# Patient Record
Sex: Female | Born: 1945 | Race: White | Hispanic: No | Smoking: Never smoker
Health system: Southern US, Community
[De-identification: ages and names within clinical notes are randomized; demographics above are authoritative.]

## PROBLEM LIST (undated history)

## (undated) DIAGNOSIS — F32A Depression, unspecified: Secondary | ICD-10-CM

## (undated) DIAGNOSIS — M797 Fibromyalgia: Secondary | ICD-10-CM

## (undated) DIAGNOSIS — K219 Gastro-esophageal reflux disease without esophagitis: Secondary | ICD-10-CM

## (undated) DIAGNOSIS — F329 Major depressive disorder, single episode, unspecified: Secondary | ICD-10-CM

## (undated) DIAGNOSIS — K449 Diaphragmatic hernia without obstruction or gangrene: Secondary | ICD-10-CM

## (undated) DIAGNOSIS — M1710 Unilateral primary osteoarthritis, unspecified knee: Secondary | ICD-10-CM

## (undated) DIAGNOSIS — F419 Anxiety disorder, unspecified: Secondary | ICD-10-CM

## (undated) DIAGNOSIS — M1990 Unspecified osteoarthritis, unspecified site: Secondary | ICD-10-CM

## (undated) DIAGNOSIS — K5790 Diverticulosis of intestine, part unspecified, without perforation or abscess without bleeding: Secondary | ICD-10-CM

## (undated) DIAGNOSIS — I10 Essential (primary) hypertension: Secondary | ICD-10-CM

## (undated) DIAGNOSIS — H269 Unspecified cataract: Secondary | ICD-10-CM

## (undated) DIAGNOSIS — K317 Polyp of stomach and duodenum: Secondary | ICD-10-CM

## (undated) DIAGNOSIS — M179 Osteoarthritis of knee, unspecified: Secondary | ICD-10-CM

## (undated) HISTORY — DX: Unspecified cataract: H26.9

## (undated) HISTORY — PX: COLONOSCOPY: SHX174

## (undated) HISTORY — PX: CHOLECYSTECTOMY: SHX55

## (undated) HISTORY — DX: Gastro-esophageal reflux disease without esophagitis: K21.9

## (undated) HISTORY — DX: Polyp of stomach and duodenum: K31.7

## (undated) HISTORY — DX: Diaphragmatic hernia without obstruction or gangrene: K44.9

## (undated) HISTORY — PX: UPPER GASTROINTESTINAL ENDOSCOPY: SHX188

## (undated) HISTORY — DX: Fibromyalgia: M79.7

## (undated) HISTORY — DX: Osteoarthritis of knee, unspecified: M17.9

## (undated) HISTORY — DX: Unilateral primary osteoarthritis, unspecified knee: M17.10

## (undated) HISTORY — DX: Essential (primary) hypertension: I10

## (undated) HISTORY — PX: POLYPECTOMY: SHX149

## (undated) HISTORY — DX: Unspecified osteoarthritis, unspecified site: M19.90

## (undated) HISTORY — DX: Depression, unspecified: F32.A

## (undated) HISTORY — DX: Diverticulosis of intestine, part unspecified, without perforation or abscess without bleeding: K57.90

## (undated) HISTORY — DX: Anxiety disorder, unspecified: F41.9

## (undated) NOTE — *Deleted (*Deleted)
Diagnosis: COVID-19  Physician: Dr. Patrick Wright  Procedure: [facility] Med: Sotrovimab infusion - Provided patient with sotrovimab fact sheet for patients, parents, and caregivers prior to infusion.   Complications: No immediate complications noted  Discharge: Discharged home    

---
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
## 2004-03-22 ENCOUNTER — Ambulatory Visit (HOSPITAL_COMMUNITY): Admission: RE | Admit: 2004-03-22 | Discharge: 2004-03-22 | Payer: Self-pay | Admitting: Geriatric Medicine

## 2016-01-13 DIAGNOSIS — M79675 Pain in left toe(s): Secondary | ICD-10-CM | POA: Diagnosis not present

## 2016-01-13 DIAGNOSIS — M7989 Other specified soft tissue disorders: Secondary | ICD-10-CM | POA: Diagnosis not present

## 2016-01-21 DIAGNOSIS — Z1231 Encounter for screening mammogram for malignant neoplasm of breast: Secondary | ICD-10-CM | POA: Diagnosis not present

## 2016-03-04 DIAGNOSIS — R5383 Other fatigue: Secondary | ICD-10-CM | POA: Diagnosis not present

## 2016-03-04 DIAGNOSIS — I10 Essential (primary) hypertension: Secondary | ICD-10-CM | POA: Diagnosis not present

## 2016-03-04 DIAGNOSIS — F3342 Major depressive disorder, recurrent, in full remission: Secondary | ICD-10-CM | POA: Diagnosis not present

## 2016-03-04 DIAGNOSIS — R7989 Other specified abnormal findings of blood chemistry: Secondary | ICD-10-CM | POA: Diagnosis not present

## 2016-03-08 DIAGNOSIS — F4321 Adjustment disorder with depressed mood: Secondary | ICD-10-CM | POA: Diagnosis not present

## 2016-03-08 DIAGNOSIS — N952 Postmenopausal atrophic vaginitis: Secondary | ICD-10-CM | POA: Diagnosis not present

## 2016-03-10 DIAGNOSIS — R748 Abnormal levels of other serum enzymes: Secondary | ICD-10-CM | POA: Diagnosis not present

## 2016-03-11 DIAGNOSIS — R918 Other nonspecific abnormal finding of lung field: Secondary | ICD-10-CM | POA: Diagnosis not present

## 2016-03-11 DIAGNOSIS — K7689 Other specified diseases of liver: Secondary | ICD-10-CM | POA: Diagnosis not present

## 2016-05-17 DIAGNOSIS — H16223 Keratoconjunctivitis sicca, not specified as Sjogren's, bilateral: Secondary | ICD-10-CM | POA: Diagnosis not present

## 2016-05-17 DIAGNOSIS — H02402 Unspecified ptosis of left eyelid: Secondary | ICD-10-CM | POA: Diagnosis not present

## 2016-05-17 DIAGNOSIS — H40023 Open angle with borderline findings, high risk, bilateral: Secondary | ICD-10-CM | POA: Diagnosis not present

## 2016-05-17 DIAGNOSIS — H524 Presbyopia: Secondary | ICD-10-CM | POA: Diagnosis not present

## 2016-05-17 DIAGNOSIS — H52203 Unspecified astigmatism, bilateral: Secondary | ICD-10-CM | POA: Diagnosis not present

## 2016-05-20 DIAGNOSIS — R945 Abnormal results of liver function studies: Secondary | ICD-10-CM | POA: Diagnosis not present

## 2016-05-20 DIAGNOSIS — I10 Essential (primary) hypertension: Secondary | ICD-10-CM | POA: Diagnosis not present

## 2016-05-20 DIAGNOSIS — F3342 Major depressive disorder, recurrent, in full remission: Secondary | ICD-10-CM | POA: Diagnosis not present

## 2016-05-20 DIAGNOSIS — L290 Pruritus ani: Secondary | ICD-10-CM | POA: Diagnosis not present

## 2016-05-20 DIAGNOSIS — K760 Fatty (change of) liver, not elsewhere classified: Secondary | ICD-10-CM | POA: Diagnosis not present

## 2016-05-20 LAB — BASIC METABOLIC PANEL
CREATININE: 0.8 (ref <=1.1)
Glucose: 99
Potassium: 4.4 (ref 3.4–5.3)
SODIUM: 139 (ref 137–147)

## 2016-05-20 LAB — HEPATIC FUNCTION PANEL
ALK PHOS: 82 (ref 25–125)
ALT: 119 — AB (ref 7–35)
AST: 83 — AB (ref 13–35)

## 2016-06-07 DIAGNOSIS — M1712 Unilateral primary osteoarthritis, left knee: Secondary | ICD-10-CM | POA: Diagnosis not present

## 2016-06-07 DIAGNOSIS — Z01818 Encounter for other preprocedural examination: Secondary | ICD-10-CM | POA: Diagnosis not present

## 2016-06-07 DIAGNOSIS — M25462 Effusion, left knee: Secondary | ICD-10-CM | POA: Diagnosis not present

## 2016-06-28 DIAGNOSIS — H02402 Unspecified ptosis of left eyelid: Secondary | ICD-10-CM | POA: Diagnosis not present

## 2016-08-23 DIAGNOSIS — H02402 Unspecified ptosis of left eyelid: Secondary | ICD-10-CM | POA: Diagnosis not present

## 2016-11-22 NOTE — Progress Notes (Addendum)
[place] at [facility] [address] [place], [place] [address] [phone]  Date:  11/23/2016   Name:  Misty Rivas   DOB:  [birth year]   MRN:  [id number]  PCP:  Darreld Mclean, MD    Chief Complaint: Establish Care and Hip Pain Marcha Solders )   History of Present Illness:  Syna Gad is a 71 y.o. very pleasant female patient who presents with the following:  New patient here today to establish care She is "pretty much retired"- her husband was the chaplain at [employer] for about 20 years and he is now retired.  He has some dementia thorugh due to head injuries incurred while playing college football. This is a pretty new thing to the family- just dx a few months ago- and it has pretty hard so far for Trishelle.  She is not really able to leave her husband alone- he follows her everywhere- and she is feeling overwhelmed  She has 2 sons and 4 grands She likes to work in her yard, "and I try not to eat and I try to exercise."  2 of her grands are in [place], the others are in [place]   She has generally been in good health Does have hiatal hernia and some reflux- she has to watch her diet carefully Had c/s x2, chole  Notes that she does not sleep that well Dr. Kris Mouton- her GYN- just retired She was seeing Dr. Nance Pew in [place] in the past for her primary care but has decided to come here instead on the recommendation of a friend   She did have a knee scope a couple of years ago, and is planning to have a left replacement in January She also has right hip pain which is a concern for her today She has not had any x-rays of her right hip as of yet  Her right hip has bothered her for 6 months or so- worse the last couple of weeks.  It is actually bother her more than her knee at this time! She is not aware of any particular injury to her hip The hip pain will wax and wane squatting and bending bothers her a lot  She is using aleve for  her hip and knee   She has been using nexium for a "long long time"  She had heard that chronic PPI use might not be good for her bone density and wonders if she should make a change   She is a never smoker She did have a dexa scan- she thinks 4-5 years ago  She was also told that her LFTs were high and that she had fatty liver on [place] not too long ago- she is a bit worried about this and would like to follow-up on her LFTs today Patient Active Problem List   Diagnosis Date Noted  . Essential hypertension 11/23/2016  . Estrogen deficiency 11/23/2016  pulse 60 on 5/18 58 on  2/18  She has noted higher BP at home for about 6 months now - about 140/80-90 She is on metoprolol She has not noted any dizzy/ lightheaded feeling   Past Medical History:  Diagnosis Date  . Arthritis     Past Surgical History:  Procedure Laterality Date  . CESAREAN SECTION    . CHOLECYSTECTOMY    . TONSILLECTOMY  1955    Social History   Tobacco Use  . Smoking status: Never Smoker  . Smokeless tobacco:  Never Used  Substance Use Topics  . Alcohol use: No    Frequency: Never  . Drug use: No    Family History  Problem Relation Age of Onset  . Mental illness Mother   . Heart disease Father   . Cancer Father   . Cancer Sister     Allergies  Allergen Reactions  . Lisinopril Other (See Comments)    Per pt cannot tolerate medication makes her fatigued  . Sulfa Antibiotics Other (See Comments) and Rash    Other reaction(s): Other (See Comments) Unknown reaction per pt, hasnt taken in a long time. Unknown reaction per pt, hasnt taken in a long time.     Medication list has been reviewed and updated.  Current Outpatient Medications on File Prior to Visit  Medication Sig Dispense Refill  . Calcium Carb-Cholecalciferol (CALCIUM-VITAMIN D) 500-200 MG-UNIT tablet Take by mouth.    . escitalopram (LEXAPRO) 20 MG tablet escitalopram 20 mg tablet    . esomeprazole (NEXIUM) 20 MG capsule Take 20 mg  by mouth.    . metoprolol tartrate (LOPRESSOR) 50 MG tablet Take 25 mg 2 (two) times daily by mouth.  6  . Multiple Vitamin (MULTI-VITAMINS) TABS Take by mouth.    . naproxen sodium (ALEVE) 220 MG tablet Take 220 mg by mouth.     No current facility-administered medications on file prior to visit.     Review of Systems:  As per HPI- otherwise negative. No fever or chills Admits that she has been stressed by things at home    Physical Examination: Vitals:   11/23/16 1416 11/23/16 1441  BP: (!) 174/96 (!) 150/90  Pulse: (!) 49 60  Temp: 97.7 F (36.5 C)   SpO2: 97%    Vitals:   11/23/16 1416  Weight: 186 lb 6.4 oz (84.6 kg)  Height: 5' 2"  (1.575 m)   Body mass index is 34.09 kg/m. Ideal Body Weight: Weight in (lb) to have BMI = 25: 136.4  GEN: WDWN, NAD, Non-toxic, A & O x 3, obese, otherwise looks well HEENT: Atraumatic, Normocephalic. Neck supple. No masses, No LAD.  Bilateral TM wnl, oropharynx normal.  PEERL,EOMI.   Ears and Nose: No external deformity. CV: RRR, No M/G/R. No JVD. No thrill. No extra heart sounds. PULM: CTA B, no wheezes, crackles, rhonchi. No retractions. No resp. distress. No accessory muscle use. ABD: S, NT, ND, +BS. No rebound. No HSM. EXTR: No c/c/e NEURO Normal gait.  PSYCH: Normally interactive. Conversant. Not depressed or anxious appearing.  Calm demeanor. A bit tearful while discussing her husband's situation  Right hip: normal ROM, no pain with joint manipulation.  She is tender over the lateral hip but seems a bit more posterior than is typical for GTB   Assessment and Plan: Essential hypertension - Plan: amLODipine (NORVASC) 5 MG tablet  Hepatic steatosis - Plan: Comprehensive metabolic panel  Right hip pain - Plan: DG HIP UNILAT W OR W/O PELVIS 2-3 VIEWS RIGHT  Estrogen deficiency - Plan: DG Bone Density  Other stressful life events affecting family and household   Here today to establish care and discuss a few concerns Her BP  is still high- will add 2.5 mg of amlodipine to her metoprolol Await hip films Check CMP Discussed getting some in home help to give her a break from care-giving Will plan further follow- up pending labs.  Signed Lamar Blinks, MD  11/15- received her labs and x-rays   From care everywhere  5/18       2/18      12/17 AST  83 107      46 ALT  119 125       66  abd [place] 3/18 IMPRESSION: 1. Cholecystectomy.No biliary distention.  2. Increased hepatic echogenicity consistent with fatty infiltration and/or hepatocellular disease.  Results for orders placed or performed in visit on 11/23/16  Comprehensive metabolic panel  Result Value Ref Range   Sodium 141 135 - 145 mEq/L   Potassium 4.7 3.5 - 5.1 mEq/L   Chloride 104 96 - 112 mEq/L   CO2 29 19 - 32 mEq/L   Glucose, Bld 85 70 - 99 mg/dL   BUN 17 6 - 23 mg/dL   Creatinine, Ser 0.79 0.40 - 1.20 mg/dL   Total Bilirubin 0.8 0.2 - 1.2 mg/dL   Alkaline Phosphatase 68 39 - 117 U/L   AST 152 (H) 0 - 37 U/L   ALT 161 (H) 0 - 35 U/L   Total Protein 7.2 6.0 - 8.3 g/dL   Albumin 4.4 3.5 - 5.2 g/dL   Calcium 10.1 8.4 - 10.5 mg/dL   GFR 76.48 >60.00 mL/min   Dg Hip Unilat W Or W/o Pelvis 2-3 Views Right  Result Date: 11/24/2016 CLINICAL DATA:  Right hand pain.  No prior injury. EXAM: DG HIP (WITH OR WITHOUT PELVIS) 2-3V RIGHT COMPARISON:  No prior exam available for comparison. FINDINGS: Degenerative changes lumbar spine and both hips. No acute bony or joint abnormality identified. Rounded ossification noted adjacent to the greater trochanter most likely dystrophic calcification. IMPRESSION: Degenerative changes lumbar spine and both hips. No acute abnormality identified. Electronically Signed   By: Marcello Moores  Register   On: 11/24/2016 07:21    Received her labs and films

## 2016-11-23 ENCOUNTER — Encounter: Payer: Self-pay | Admitting: Family Medicine

## 2016-11-23 ENCOUNTER — Ambulatory Visit (HOSPITAL_BASED_OUTPATIENT_CLINIC_OR_DEPARTMENT_OTHER)
Admission: RE | Admit: 2016-11-23 | Discharge: 2016-11-23 | Disposition: A | Discharge status: Home / Self Care | Payer: Medicare Other | Source: Ambulatory Visit | Attending: Family Medicine | Admitting: Family Medicine

## 2016-11-23 ENCOUNTER — Ambulatory Visit (INDEPENDENT_AMBULATORY_CARE_PROVIDER_SITE_OTHER): Payer: Medicare Other | Admitting: Family Medicine

## 2016-11-23 VITALS — BP 150/90 | HR 60 | Temp 97.7°F | Ht 62.0 in | Wt 186.4 lb

## 2016-11-23 DIAGNOSIS — M25551 Pain in right hip: Secondary | ICD-10-CM | POA: Insufficient documentation

## 2016-11-23 DIAGNOSIS — E2839 Other primary ovarian failure: Secondary | ICD-10-CM | POA: Diagnosis not present

## 2016-11-23 DIAGNOSIS — Z6379 Other stressful life events affecting family and household: Secondary | ICD-10-CM

## 2016-11-23 DIAGNOSIS — I10 Essential (primary) hypertension: Secondary | ICD-10-CM

## 2016-11-23 DIAGNOSIS — M160 Bilateral primary osteoarthritis of hip: Secondary | ICD-10-CM | POA: Insufficient documentation

## 2016-11-23 DIAGNOSIS — M1611 Unilateral primary osteoarthritis, right hip: Secondary | ICD-10-CM | POA: Diagnosis not present

## 2016-11-23 DIAGNOSIS — K760 Fatty (change of) liver, not elsewhere classified: Secondary | ICD-10-CM

## 2016-11-23 DIAGNOSIS — M47896 Other spondylosis, lumbar region: Secondary | ICD-10-CM | POA: Diagnosis not present

## 2016-11-23 MED ORDER — AMLODIPINE BESYLATE 5 MG PO TABS: 5.0000 mg | ORAL_TABLET | Freq: Every day | ORAL | 3 refills | Status: DC

## 2016-11-23 NOTE — Patient Instructions (Signed)
It was very nice to see you today!  Take care and I will be in touch with your hip x-ray report and your labs asap Please add amlodipine- start with a 1/2 tablet- to your blood pressure regimen.  Please continue to monitor your BP at home- would like to see you about 120- 130/ 80s.    As we discussed, you likely would benefit from some "respite care," or someone to help your husband so you can get our on your own for a little while. This break will also make you a better and more patient caregiver, so it is not a selfish thing to do at all!

## 2016-11-24 ENCOUNTER — Encounter: Payer: Self-pay | Admitting: Family Medicine

## 2016-11-24 LAB — COMPREHENSIVE METABOLIC PANEL
ALK PHOS: 68 U/L (ref 39–117)
ALT: 161 U/L — AB (ref 0–35)
AST: 152 U/L — ABNORMAL HIGH (ref 0–37)
Albumin: 4.4 g/dL (ref 3.5–5.2)
BILIRUBIN TOTAL: 0.8 mg/dL (ref 0.2–1.2)
BUN: 17 mg/dL (ref 6–23)
CALCIUM: 10.1 mg/dL (ref 8.4–10.5)
CO2: 29 mEq/L (ref 19–32)
Chloride: 104 mEq/L (ref 96–112)
Creatinine, Ser: 0.79 mg/dL (ref 0.40–1.20)
GFR: 76.48 mL/min (ref >60.00)
Glucose, Bld: 85 mg/dL (ref 70–99)
Potassium: 4.7 mEq/L (ref 3.5–5.1)
Sodium: 141 mEq/L (ref 135–145)
TOTAL PROTEIN: 7.2 g/dL (ref 6.0–8.3)

## 2016-11-25 ENCOUNTER — Encounter: Payer: Self-pay | Admitting: Family Medicine

## 2016-11-25 DIAGNOSIS — R7401 Elevation of levels of liver transaminase levels: Secondary | ICD-10-CM

## 2016-11-25 DIAGNOSIS — R740 Nonspecific elevation of levels of transaminase and lactic acid dehydrogenase [LDH]: Principal | ICD-10-CM

## 2016-12-03 NOTE — Progress Notes (Signed)
[place] at [facility] [address] [place], [place] [address] [phone]  Date:  12/05/2016   Name:  Misty Rivas   DOB:  [birth year]   MRN:  [id number]  PCP:  Darreld Mclean, MD    Chief Complaint: Follow-up and Injections   History of Present Illness:  Misty Rivas is a 71 y.o. very pleasant female patient who presents with the following:  She would like to try a RIGHT hip injection for bursitis - we did plain film for her recently due to hip pain. She is having left knee surgery in January as well  Dg Hip Unilat W Or W/o Pelvis 2-3 Views Right  Result Date: 11/24/2016 CLINICAL DATA:  Right hand pain.  No prior injury. EXAM: DG HIP (WITH OR WITHOUT PELVIS) 2-3V RIGHT COMPARISON:  No prior exam available for comparison. FINDINGS: Degenerative changes lumbar spine and both hips. No acute bony or joint abnormality identified. Rounded ossification noted adjacent to the greater trochanter most likely dystrophic calcification. IMPRESSION: Degenerative changes lumbar spine and both hips. No acute abnormality identified. Electronically Signed   By: Marcello Moores  Register   On: 11/24/2016 07:21   She does have elevated LFTs- she was just told about this issue earlier this year, April  She had an [place] around the same time; showed just fatty liver Her LFTs were higher than I would typically expect with fatty liver- she is interested in seeing GI as I had suggested,  Will make this referral for her  Patient Active Problem List   Diagnosis Date Noted  . Essential hypertension 11/23/2016  . Estrogen deficiency 11/23/2016    Past Medical History:  Diagnosis Date  . Arthritis     Past Surgical History:  Procedure Laterality Date  . CESAREAN SECTION    . CHOLECYSTECTOMY    . TONSILLECTOMY  1955    Social History   Tobacco Use  . Smoking status: Never Smoker  . Smokeless tobacco: Never Used  Substance Use Topics  . Alcohol use:  No    Frequency: Never  . Drug use: No    Family History  Problem Relation Age of Onset  . Mental illness Mother   . Heart disease Father   . Cancer Father   . Cancer Sister     Allergies  Allergen Reactions  . Lisinopril Other (See Comments)    Per pt cannot tolerate medication makes her fatigued  . Sulfa Antibiotics Other (See Comments) and Rash    Other reaction(s): Other (See Comments) Unknown reaction per pt, hasnt taken in a long time. Unknown reaction per pt, hasnt taken in a long time.     Medication list has been reviewed and updated.  Current Outpatient Medications on File Prior to Visit  Medication Sig Dispense Refill  . amLODipine (NORVASC) 5 MG tablet Take 1 tablet (5 mg total) daily by mouth. 90 tablet 3  . Calcium Carb-Cholecalciferol (CALCIUM-VITAMIN D) 500-200 MG-UNIT tablet Take by mouth.    . escitalopram (LEXAPRO) 20 MG tablet escitalopram 20 mg tablet    . metoprolol tartrate (LOPRESSOR) 50 MG tablet Take 25 mg 2 (two) times daily by mouth.  6  . Multiple Vitamin (MULTI-VITAMINS) TABS Take by mouth.    . naproxen sodium (ALEVE) 220 MG tablet Take 220 mg by mouth.    . esomeprazole (NEXIUM) 20 MG capsule Take 20 mg by mouth.     No current facility-administered medications on file prior  to visit.     Review of Systems:  As per HPI- otherwise negative.   Physical Examination: Vitals:   12/05/16 1105  BP: 138/88  Pulse: 74  Temp: 97.6 F (36.4 C)   Vitals:   12/05/16 1105  Weight: 188 lb (85.3 kg)  Height: 5' 2"  (1.575 m)   Body mass index is 34.39 kg/m. Ideal Body Weight: Weight in (lb) to have BMI = 25: 136.4  GEN: WDWN, NAD, Non-toxic, A & O x 3, overweight, looks well HEENT: Atraumatic, Normocephalic. Neck supple. No masses, No LAD. Ears and Nose: No external deformity. CV: RRR, No M/G/R. No JVD. No thrill. No extra heart sounds. PULM: CTA B, no wheezes, crackles, rhonchi. No retractions. No resp. distress. No accessory muscle  use. EXTR: No c/c/e NEURO Normal gait.  PSYCH: Normally interactive. Conversant. Not depressed or anxious appearing.  Calm demeanor.   Right hip: she is tender over the right GT bursa. No redness or swelling  VC obtained. Prepped hip with betadine and lidocaine. Injected into GT bursa with 40 mg depo-medrol and 2m of 1% lido.  Pt tolerated well Assessment and Plan: Bursitis of other bursa of right hip - Plan: methylPREDNISolone acetate (DEPO-MEDROL) injection 40 mg  Transaminitis - Plan: Ambulatory referral to Gastroenterology  Injection of right GT bursa as above.  Pt is advised about signs of infection to report She did notice an improvement of her sx right after injection due to lidocaine.   Referral to GI to discuss her LFTs   Signed JLamar Blinks MD

## 2016-12-05 ENCOUNTER — Encounter: Payer: Self-pay | Admitting: Family Medicine

## 2016-12-05 ENCOUNTER — Ambulatory Visit (INDEPENDENT_AMBULATORY_CARE_PROVIDER_SITE_OTHER): Payer: Medicare Other | Admitting: Family Medicine

## 2016-12-05 VITALS — BP 138/88 | HR 74 | Temp 97.6°F | Ht 62.0 in | Wt 188.0 lb

## 2016-12-05 DIAGNOSIS — R740 Nonspecific elevation of levels of transaminase and lactic acid dehydrogenase [LDH]: Secondary | ICD-10-CM | POA: Diagnosis not present

## 2016-12-05 DIAGNOSIS — M7071 Other bursitis of hip, right hip: Secondary | ICD-10-CM | POA: Diagnosis not present

## 2016-12-05 DIAGNOSIS — R7401 Elevation of levels of liver transaminase levels: Secondary | ICD-10-CM

## 2016-12-05 MED ORDER — METHYLPREDNISOLONE ACETATE 40 MG/ML IJ SUSP: 40.0000 mg | Freq: Once | INTRAMUSCULAR | Status: DC

## 2016-12-05 NOTE — Patient Instructions (Addendum)
You got an injection into your hip today- please let me know how this works for you.   I am going to refer you to a Gastroenterologist to look further at your liver   Please let me know if any sign of infection of your hip Happy holidays!

## 2016-12-06 ENCOUNTER — Encounter: Payer: Self-pay | Admitting: Nurse Practitioner

## 2016-12-07 ENCOUNTER — Ambulatory Visit (HOSPITAL_BASED_OUTPATIENT_CLINIC_OR_DEPARTMENT_OTHER)
Admission: RE | Admit: 2016-12-07 | Discharge: 2016-12-07 | Disposition: A | Discharge status: Home / Self Care | Payer: Medicare Other | Source: Ambulatory Visit | Attending: Family Medicine | Admitting: Family Medicine

## 2016-12-07 ENCOUNTER — Encounter (HOSPITAL_BASED_OUTPATIENT_CLINIC_OR_DEPARTMENT_OTHER): Payer: Self-pay | Admitting: Radiology

## 2016-12-07 ENCOUNTER — Encounter: Payer: Self-pay | Admitting: Family Medicine

## 2016-12-07 DIAGNOSIS — E2839 Other primary ovarian failure: Secondary | ICD-10-CM | POA: Insufficient documentation

## 2016-12-07 DIAGNOSIS — Z780 Asymptomatic menopausal state: Secondary | ICD-10-CM | POA: Diagnosis not present

## 2016-12-07 DIAGNOSIS — Z13820 Encounter for screening for osteoporosis: Secondary | ICD-10-CM | POA: Diagnosis not present

## 2016-12-14 ENCOUNTER — Other Ambulatory Visit (INDEPENDENT_AMBULATORY_CARE_PROVIDER_SITE_OTHER): Payer: Medicare Other

## 2016-12-14 ENCOUNTER — Ambulatory Visit (INDEPENDENT_AMBULATORY_CARE_PROVIDER_SITE_OTHER): Payer: Medicare Other | Admitting: Nurse Practitioner

## 2016-12-14 ENCOUNTER — Encounter: Payer: Self-pay | Admitting: Nurse Practitioner

## 2016-12-14 VITALS — BP 140/88 | HR 60 | Ht 62.0 in | Wt 182.5 lb

## 2016-12-14 DIAGNOSIS — R7989 Other specified abnormal findings of blood chemistry: Secondary | ICD-10-CM

## 2016-12-14 DIAGNOSIS — R945 Abnormal results of liver function studies: Secondary | ICD-10-CM

## 2016-12-14 DIAGNOSIS — Z1211 Encounter for screening for malignant neoplasm of colon: Secondary | ICD-10-CM | POA: Diagnosis not present

## 2016-12-14 LAB — HEPATIC FUNCTION PANEL
ALBUMIN: 4.6 g/dL (ref 3.5–5.2)
ALT: 163 U/L — AB (ref 0–35)
AST: 121 U/L — ABNORMAL HIGH (ref 0–37)
Alkaline Phosphatase: 72 U/L (ref 39–117)
Bilirubin, Direct: 0.2 mg/dL (ref 0.0–0.3)
TOTAL PROTEIN: 7.3 g/dL (ref 6.0–8.3)
Total Bilirubin: 0.8 mg/dL (ref 0.2–1.2)

## 2016-12-14 LAB — FERRITIN: Ferritin: 366 ng/mL — ABNORMAL HIGH (ref 10.0–291.0)

## 2016-12-14 LAB — IGA: IgA: 248 mg/dL (ref 68–378)

## 2016-12-14 LAB — PROTIME-INR
INR: 1 ratio (ref 0.8–1.0)
PROTHROMBIN TIME: 10.8 s (ref 9.6–13.1)

## 2016-12-14 NOTE — Patient Instructions (Signed)
If you are age 70 or older, your body mass index should be between 23-30. Your Body mass index is 33.38 kg/m. If this is out of the aforementioned range listed, please consider follow up with your Primary Care Provider.  If you are age 89 or younger, your body mass index should be between 19-25. Your Body mass index is 33.38 kg/m. If this is out of the aformentioned range listed, please consider follow up with your Primary Care Provider.   Your physician has requested that you go to the basement for lab work before leaving today.  Will call with results.  Thank you for choosing me and [facility].   Tye Savoy, NP

## 2016-12-14 NOTE — Progress Notes (Addendum)
Chief Complaint:  Abnormal liver chemistries   HPI:  Patient is a 71 year old female, new to the practice referred by PCP Dr. Lorelei Pont for evaluation of abnormal liver chemistries. Patient says she was first told about her abnormal liver chemistries about a year and a half ago in [facility]. I do see in [place] that she had transaminitis in December 2017. At that time her AST was 46 and ALT 66. Ultrasound was done and showed fatty liver.  In February 2018 AST was up to 107, ALT 125.  In May of this year AST 83, ALT 119. Most current labs in mid November show AST was 152, ALT 161, bilirubin and alkaline phosphatase have always been normal. No med changes except Norvasc which is just recent.  She takes Aleve about twice a week. No [place] of liver disease. She consumes ETOH only about once a month. No use of herbs / supplements. She takes Zantac for GERD.   Patient has no abdominal pain. Her weight is stable. No    Past Medical History:  Diagnosis Date  . Acid reflux   . Arthritis   . Degenerative joint disease of knee   . Hiatal hernia      Past Surgical History:  Procedure Laterality Date  . CESAREAN SECTION    . CHOLECYSTECTOMY    . TONSILLECTOMY  1955   Family History  Problem Relation Age of Onset  . Mental illness Mother   . Heart disease Father   . Prostate cancer Father   . Breast cancer Sister   . Colon cancer Neg Hx   . Stomach cancer Neg Hx   . Rectal cancer Neg Hx   . Esophageal cancer Neg Hx   . Liver cancer Neg Hx    Social History   Tobacco Use  . Smoking status: Never Smoker  . Smokeless tobacco: Never Used  Substance Use Topics  . Alcohol use: Yes    Frequency: Never    Comment: occasional once monthly  . Drug use: No   Current Outpatient Medications  Medication Sig Dispense Refill  . amLODipine (NORVASC) 5 MG tablet Take 1 tablet (5 mg total) daily by mouth. 90 tablet 3  . Calcium Carb-Cholecalciferol (CALCIUM-VITAMIN D) 500-200 MG-UNIT  tablet Take by mouth.    . escitalopram (LEXAPRO) 20 MG tablet escitalopram 20 mg tablet    . metoprolol tartrate (LOPRESSOR) 50 MG tablet Take 25 mg 2 (two) times daily by mouth.  6  . Multiple Vitamin (MULTI-VITAMINS) TABS Take by mouth.    . naproxen sodium (ALEVE) 220 MG tablet Take 220 mg by mouth as needed.      No current facility-administered medications for this visit.    Allergies  Allergen Reactions  . Lisinopril Other (See Comments)    Per pt cannot tolerate medication makes her fatigued  . Sulfa Antibiotics Other (See Comments) and Rash    Other reaction(s): Other (See Comments) Unknown reaction per pt, hasnt taken in a long time. Unknown reaction per pt, hasnt taken in a long time.      Review of Systems: Positive for arthritis, vision changes, excessive urination, and urine leakage. All other systems reviewed and negative except where noted in HPI.    Physical Exam: Ht 5' 2"  (1.575 m)   Wt 182 lb 8 oz (82.8 kg)   BMI 33.38 kg/m  Constitutional:  Well-developed, white female in no acute distress. Psychiatric: Normal mood and affect. Behavior is normal. EENT: Pupils normal.  Conjunctivae  are normal. No scleral icterus. Neck supple.  Cardiovascular: Normal rate, regular rhythm. No edema Pulmonary/chest: Effort normal and breath sounds normal. No wheezing, rales or rhonchi. Abdominal: Soft, nondistended. Nontender. Bowel sounds active throughout. There are no masses palpable. No hepatomegaly. Lymphadenopathy: No cervical adenopathy noted. Neurological: Alert and oriented to person place and time. Skin: Skin is warm and dry. No rashes noted.   ASSESSMENT AND PLAN:  1. Pleasant 71 yo female with transaminitis, apparently first recognized a year ago through [place].  Transaminases initially only mildly elevated, now ALT consistently above 100. Most recent labs : AST / ALT in 150's-160 range. U/S last year suggested fatty liver.  -Transaminases have been abnormal on  several occasions over the last year and higher than typically seen with steatosis alone. Will obtain all the markers of chronic liver disease such as ANA, ASMA, ferritin, viral hep studies, etc.. Additionally will check tTg, IgA.  -repeat LFT, INR as patient is for knee surgery in a few weeks and if progressive transaminitis she may to postpone surgery until we can sort things out.   2. Colon cancer screening. Apparently had last colonoscopy 5 years ago, says she is due for another. Will request records.    Addendum to 02/14/17: Records from [facility]. *EGD October 2015 for evaluation of GERD and gastric polyps.  Z line was normal.  Esophagus appeared normal.  A small hiatal hernia was seen.  Gastric mucosa showed erythema.  Biopsies taken.  Multiple 8-5 mm gastric polyps noted and 8 of the largest were cold snared.  Multiple smaller polyps remained.  Duodenum appeared normal.  Gastric biopsies showed mild chronic gastritis.  H. pylori stain negative.  Strict polyps compatible with fundic gland polyp, no evidence for adenomatous change.  *Colonoscopy October 2015- could not advance scope past the sigmoid due to sharp angulated turn.  Switched to pediatric scope and able to to reach the cecum.  Bowel prep was fair.  Findings included mild diverticulosis of the left colon.  No polyps.  Small hemorrhoids on retroflex view of the rectum.  Timing of next colonoscopy can be discussed with patient at time of upcoming visit with Dr. Silverio Decamp later this month. Will scan records into Epic.   Tye Savoy, NP  12/14/2016, 11:11 AM  Cc: Copland, Gay Filler, MD

## 2016-12-16 NOTE — Progress Notes (Signed)
Reviewed and agree with documentation and assessment and plan. K. Veena Nandigam , MD   

## 2016-12-18 LAB — CERULOPLASMIN: Ceruloplasmin: 31 mg/dL (ref 18–53)

## 2016-12-18 LAB — ANA: ANA: POSITIVE — AB

## 2016-12-18 LAB — HEPATITIS B SURFACE ANTIGEN: Hepatitis B Surface Ag: NONREACTIVE

## 2016-12-18 LAB — TISSUE TRANSGLUTAMINASE, IGA: (TTG) AB, IGA: 1 U/mL

## 2016-12-18 LAB — ANTI-NUCLEAR AB-TITER (ANA TITER)

## 2016-12-18 LAB — HEPATITIS C ANTIBODY
Hepatitis C Ab: NONREACTIVE
SIGNAL TO CUT-OFF: 0.01 (ref <1.00)

## 2016-12-18 LAB — HEPATITIS A ANTIBODY, TOTAL: Hepatitis A AB,Total: NONREACTIVE

## 2016-12-18 LAB — MITOCHONDRIAL ANTIBODIES: Mitochondrial M2 Ab, IgG: <=20 U

## 2016-12-18 LAB — ANTI-SMOOTH MUSCLE ANTIBODY, IGG

## 2016-12-22 ENCOUNTER — Other Ambulatory Visit: Payer: Self-pay

## 2016-12-22 ENCOUNTER — Other Ambulatory Visit: Payer: Self-pay | Admitting: Nurse Practitioner

## 2016-12-22 ENCOUNTER — Telehealth: Payer: Self-pay

## 2016-12-22 DIAGNOSIS — R945 Abnormal results of liver function studies: Principal | ICD-10-CM

## 2016-12-22 DIAGNOSIS — R7989 Other specified abnormal findings of blood chemistry: Secondary | ICD-10-CM

## 2016-12-22 NOTE — Telephone Encounter (Signed)
-----   Message from Willia Craze, NP sent at 12/21/2016  4:22 PM EST ----- Misty Rivas, please let patient know that her liver enzymes are still elevated. One of her autoimmune markers was elevated.  Would you ask her to come in for an IgG antibody and also please get a liver-kidney microsomal antibody. Thanks

## 2016-12-22 NOTE — Telephone Encounter (Signed)
Nevin Bloodgood would you please clarify the IgG antibody?

## 2016-12-22 NOTE — Addendum Note (Signed)
Addended by: Virgina Evener A on: 12/22/2016 09:05 AM   Modules accepted: Orders

## 2016-12-22 NOTE — Telephone Encounter (Signed)
Patient agrees to come in for additional labs.

## 2016-12-26 ENCOUNTER — Other Ambulatory Visit: Payer: Medicare Other

## 2016-12-26 DIAGNOSIS — R945 Abnormal results of liver function studies: Principal | ICD-10-CM

## 2016-12-26 DIAGNOSIS — R7989 Other specified abnormal findings of blood chemistry: Secondary | ICD-10-CM

## 2016-12-28 LAB — IGG: IGG (IMMUNOGLOBIN G), SERUM: 799 mg/dL (ref 694–1618)

## 2016-12-28 LAB — ANTI-MICROSOMAL ANTIBODY LIVER / KIDNEY: LKM1 Ab: <=20 U (ref <=20.0)

## 2017-01-13 ENCOUNTER — Telehealth: Payer: Self-pay | Admitting: Family Medicine

## 2017-01-13 ENCOUNTER — Encounter: Payer: Self-pay | Admitting: Family Medicine

## 2017-01-13 NOTE — Telephone Encounter (Signed)
Message to pt- I think she is looking for her GI labs from 12/5- will ask GI to give her a call

## 2017-01-13 NOTE — Telephone Encounter (Signed)
Copied from [place] [id number]. Topic: Inquiry >> Jan 13, 2017 11:16 AM Corie Chiquito, NT wrote: Reason for TAV:[id number] called and stated that she hasn't heard anything back about the blood work that she had done in the office. If someone could give her a call back at [phone]

## 2017-01-19 ENCOUNTER — Ambulatory Visit: Payer: Medicare Other | Admitting: Family Medicine

## 2017-01-21 NOTE — Progress Notes (Addendum)
[place] at [facility], [place], [place] [address] [phone]  Date:  01/23/2017   Name:  Misty Rivas   DOB:  [birth year]   MRN:  [id number]  PCP:  Misty Mclean, MD    Chief Complaint: Follow-up (Pt here for f/u visit.)   History of Present Illness:  Misty Rivas is a 72 y.o. very pleasant female patient who presents with the following:  Here today for a follow-up visit Last seen here in November- I sent her to see GI due to elevated liver function  She is "pretty much retired"- her husband was the chaplain at [employer] for about 20 years and he is now retired.  He has some dementia thorugh due to head injuries incurred while playing college football. This is a pretty new thing to the family- just dx a few months ago- and it has pretty hard so far for [name].  She is not really able to leave her husband alone- he follows her everywhere- and she is feeling overwhelmed  She has 2 sons and 4 grands She likes to work in her yard, "and I try not to eat and I try to exercise."  2 of her grands are in [place], the others are in [place]   She saw Wallace Going, NP with [facility] in December and had some lab evaluation  Lab Results  Component Value Date   ALT 163 (H) 12/14/2016   AST 121 (H) 12/14/2016   ALKPHOS 72 12/14/2016   BILITOT 0.8 12/14/2016   Sherilyn is confused about what is going on with her liver labs-  They had wanted her to come in for an IgG level, which she did.  However she did not hear further about her labs and was not sure what to do next  We went over her labs- she did have a positive ANA, but negative IgG and ceruloplasmin, negative Anti-liver/kidney microsomal antibodies  There is no family history of lupus per her knowledge She is feeling very well overall   Last colonoscopy in 2015  She brings in her past physician records ([facility]) today which I will abstract and scan as needed     Patient Active Problem List   Diagnosis Date Noted  . Essential hypertension 11/23/2016  . Estrogen deficiency 11/23/2016    Past Medical History:  Diagnosis Date  . Acid reflux   . Arthritis   . Degenerative joint disease of knee   . Fibromyalgia   . Hiatal hernia     Past Surgical History:  Procedure Laterality Date  . CESAREAN SECTION    . CHOLECYSTECTOMY    . TONSILLECTOMY  1955    Social History   Tobacco Use  . Smoking status: Never Smoker  . Smokeless tobacco: Never Used  Substance Use Topics  . Alcohol use: Yes    Frequency: Never    Comment: occasional once monthly  . Drug use: No    Family History  Problem Relation Age of Onset  . Mental illness Mother   . Heart disease Father   . Prostate cancer Father   . Breast cancer Sister   . Colon cancer Neg Hx   . Stomach cancer Neg Hx   . Rectal cancer Neg Hx   . Esophageal cancer Neg Hx   . Liver cancer Neg Hx     Allergies  Allergen Reactions  . Lisinopril Other (See Comments)    Per pt cannot  tolerate medication makes her fatigued  . Sulfa Antibiotics Other (See Comments) and Rash    Other reaction(s): Other (See Comments) Unknown reaction per pt, hasnt taken in a long time. Unknown reaction per pt, hasnt taken in a long time.     Medication list has been reviewed and updated.  Current Outpatient Medications on File Prior to Visit  Medication Sig Dispense Refill  . amLODipine (NORVASC) 5 MG tablet Take 1 tablet (5 mg total) daily by mouth. 90 tablet 3  . Calcium Carb-Cholecalciferol (CALCIUM-VITAMIN D) 500-200 MG-UNIT tablet Take by mouth.    . escitalopram (LEXAPRO) 20 MG tablet escitalopram 20 mg tablet    . Multiple Vitamin (MULTI-VITAMINS) TABS Take by mouth.    . naproxen sodium (ALEVE) 220 MG tablet Take 220 mg by mouth as needed.      No current facility-administered medications on file prior to visit.     Review of Systems:  As per HPI- otherwise negative.   Physical  Examination: Vitals:   01/23/17 1240  BP: 128/82  Pulse: (!) 58  Temp: 97.7 F (36.5 C)  SpO2: 98%   Vitals:   01/23/17 1240  Weight: 186 lb 12.8 oz (84.7 kg)  Height: _0  (1.575 m)   Body mass index is 34.17 kg/m. Ideal Body Weight: Weight in (lb) to have BMI = 25: 136.4  GEN: WDWN, NAD, Non-toxic, A & O x 3, obese, otherwise looks well  HEENT: Atraumatic, Normocephalic. Neck supple. No masses, No LAD.  Bilateral TM wnl, oropharynx normal.  PEERL,EOMI.   Ears and Nose: No external deformity. CV: RRR, No M/G/R. No JVD. No thrill. No extra heart sounds. PULM: CTA B, no wheezes, crackles, rhonchi. No retractions. No resp. distress. No accessory muscle use. ABD: S, NT, ND, +BS. No rebound. No HSM. EXTR: No c/c/e NEURO Normal gait.  PSYCH: Normally interactive. Conversant. Not depressed or anxious appearing.  Calm demeanor.  Benign belly, no janudice  Assessment and Plan: Arthralgia of both hands - Plan: ANA, C-reactive protein, Sedimentation rate  Positive ANA (antinuclear antibody) - Plan: ANA, C-reactive protein, Sedimentation rate  Essential hypertension - Plan: metoprolol tartrate (LOPRESSOR) 50 MG tablet  Elevated ferritin - Plan: CBC, Hepatic function panel, IBC panel  Elevated LFTs - Plan: Hepatic function panel here today to follow-up on a couple of concerns She was noted to have an elevated ANA on recent labs.  Also, she notes a lot of pain in her hand joints.  Will repeat ANA, do CRP and ESR as well She has elevated ferritin and also transaminitis- will do further eval for hemochromatosis as above  Call pt- she does not use mychart   Signed Lamar Blinks, MD  Received her labs and gave her a call, I have also been in contact with Dr. Silverio Decamp with GI over mychart:  I feel its unlikely to be Hemochromatosis causing the liver dysfunction and transaminitis as ferritin is not high enough, usually its >1000 and also iron sat would be >50%. We are getting labs on  her and will also check HH gene mutation to be complete. I was wondering about autoimmune hepatitis but she doesn't have any other markers of inflammation elevated and IgG level is normal. Its likely secondary to medication. If AST/ALT dont trend down, will need to consider liver biopsy.   Thank you  Margarette Asal   Previous Messages    ----- Message -----  From: Misty Mclean, MD  Sent: 01/24/2017  8:36 PM  To: Harl Bowie  V, MD   Hi Kavita,  Thanks for helping with this mutual pt! I wanted to touch base with you about the possibility of hemochromatosis. Her ferritin was high, but her transferrin saturation is well within normal. Would you like me to order the hemochromatosis DNA for her at this point or wait? Also I did repeat her ANA, it is the same, positive 1:160. Do you think she may have autoimmune hepatitis?       Discussed with pt- right now we do not think she has hemochromatosis, but it looks like GI is going to check for [facility] gene as well.  Per my understanding the plan right now is to follow her LFTs and do bx if she does not trend down. She does have a positive ANA and she notes joint stiffness- we will refer to rheumatology  Pt would like to be seen in [place], will refer to Dr Earnest Conroy with [facility]   Results for orders placed or performed in visit on 01/23/17  CBC  Result Value Ref Range   WBC 8.0 4.0 - 10.5 K/uL   RBC 5.10 3.87 - 5.11 Mil/uL   Platelets 334.0 150.0 - 400.0 K/uL   Hemoglobin 15.3 (H) 12.0 - 15.0 g/dL   HCT 46.1 (H) 36.0 - 46.0 %   MCV 90.6 78.0 - 100.0 fl   MCHC 33.1 30.0 - 36.0 g/dL   RDW 12.6 11.5 - 15.5 %  Hepatic function panel  Result Value Ref Range   Total Bilirubin 0.8 0.2 - 1.2 mg/dL   Bilirubin, Direct 0.1 0.0 - 0.3 mg/dL   Alkaline Phosphatase 74 39 - 117 U/L   AST 157 (H) 0 - 37 U/L   ALT 146 (H) 0 - 35 U/L   Total Protein 7.6 6.0 - 8.3 g/dL   Albumin 4.6 3.5 - 5.2 g/dL  IBC panel  Result Value Ref Range   Iron 120 42 - 145 ug/dL    Transferrin 337.0 212.0 - 360.0 mg/dL   Saturation Ratios 25.4 20.0 - 50.0 %  ANA  Result Value Ref Range   Anit Nuclear Antibody(ANA) POSITIVE (A) NEGATIVE  C-reactive protein  Result Value Ref Range   CRP 0.2 (L) 0.5 - 20.0 mg/dL  Sedimentation rate  Result Value Ref Range   Sed Rate 4 0 - 30 mm/hr  Anti-nuclear ab-titer (ANA titer)  Result Value Ref Range   ANA Pattern 1 HOMOGENEOUS (A)    ANA Titer 1 1:160 (H) titer

## 2017-01-23 ENCOUNTER — Ambulatory Visit (INDEPENDENT_AMBULATORY_CARE_PROVIDER_SITE_OTHER): Payer: Medicare Other | Admitting: Family Medicine

## 2017-01-23 ENCOUNTER — Encounter: Payer: Self-pay | Admitting: Family Medicine

## 2017-01-23 VITALS — BP 128/82 | HR 58 | Temp 97.7°F | Ht 62.0 in | Wt 186.8 lb

## 2017-01-23 DIAGNOSIS — M25541 Pain in joints of right hand: Secondary | ICD-10-CM | POA: Diagnosis not present

## 2017-01-23 DIAGNOSIS — M2560 Stiffness of unspecified joint, not elsewhere classified: Secondary | ICD-10-CM | POA: Diagnosis not present

## 2017-01-23 DIAGNOSIS — I10 Essential (primary) hypertension: Secondary | ICD-10-CM | POA: Diagnosis not present

## 2017-01-23 DIAGNOSIS — M25542 Pain in joints of left hand: Secondary | ICD-10-CM

## 2017-01-23 DIAGNOSIS — R7989 Other specified abnormal findings of blood chemistry: Secondary | ICD-10-CM | POA: Diagnosis not present

## 2017-01-23 DIAGNOSIS — R768 Other specified abnormal immunological findings in serum: Secondary | ICD-10-CM | POA: Diagnosis not present

## 2017-01-23 DIAGNOSIS — R945 Abnormal results of liver function studies: Secondary | ICD-10-CM | POA: Diagnosis not present

## 2017-01-23 LAB — CBC
HEMATOCRIT: 46.1 % — AB (ref 36.0–46.0)
HEMOGLOBIN: 15.3 g/dL — AB (ref 12.0–15.0)
MCHC: 33.1 g/dL (ref 30.0–36.0)
MCV: 90.6 fl (ref 78.0–100.0)
Platelets: 334 10*3/uL (ref 150.0–400.0)
RBC: 5.1 Mil/uL (ref 3.87–5.11)
RDW: 12.6 % (ref 11.5–15.5)
WBC: 8 10*3/uL (ref 4.0–10.5)

## 2017-01-23 LAB — SEDIMENTATION RATE: Sed Rate: 4 mm/hr (ref 0–30)

## 2017-01-23 LAB — IBC PANEL
IRON: 120 ug/dL (ref 42–145)
Saturation Ratios: 25.4 % (ref 20.0–50.0)
Transferrin: 337 mg/dL (ref 212.0–360.0)

## 2017-01-23 LAB — HEPATIC FUNCTION PANEL
ALBUMIN: 4.6 g/dL (ref 3.5–5.2)
ALT: 146 U/L — ABNORMAL HIGH (ref 0–35)
AST: 157 U/L — AB (ref 0–37)
Alkaline Phosphatase: 74 U/L (ref 39–117)
Bilirubin, Direct: 0.1 mg/dL (ref 0.0–0.3)
Total Bilirubin: 0.8 mg/dL (ref 0.2–1.2)
Total Protein: 7.6 g/dL (ref 6.0–8.3)

## 2017-01-23 LAB — C-REACTIVE PROTEIN: CRP: 0.2 mg/dL — AB (ref 0.5–20.0)

## 2017-01-23 MED ORDER — METOPROLOL TARTRATE 50 MG PO TABS: 25.0000 mg | ORAL_TABLET | Freq: Two times a day (BID) | ORAL | 3 refills | Status: DC

## 2017-01-23 NOTE — Patient Instructions (Signed)
I will be in touch with your labs and will also contact Nevin Bloodgood with GI

## 2017-01-24 ENCOUNTER — Telehealth: Payer: Self-pay

## 2017-01-24 ENCOUNTER — Other Ambulatory Visit: Payer: Self-pay

## 2017-01-24 DIAGNOSIS — R7989 Other specified abnormal findings of blood chemistry: Secondary | ICD-10-CM

## 2017-01-24 DIAGNOSIS — R945 Abnormal results of liver function studies: Secondary | ICD-10-CM

## 2017-01-24 LAB — ANTI-NUCLEAR AB-TITER (ANA TITER)

## 2017-01-24 LAB — ANA: Anti Nuclear Antibody(ANA): POSITIVE — AB

## 2017-01-24 NOTE — Telephone Encounter (Signed)
Discussed with the patient. She agrees to this plan.

## 2017-01-24 NOTE — Telephone Encounter (Signed)
-----   Message from Mauri Pole, MD sent at 01/24/2017  9:06 AM EST ----- Ferritin in only 336 with low saturation ration,  appears less likely to be HH. Ok to exclude HH.  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 Will need to exclude infectious etiology or drug induced liver injury. Any history of recent Antibiotics, OTC meds or herbal remedies? Start low dose prednisone 77m daily X1 month followed by 536mtaper every 2 weeks until she is at 54m554maily. Recheck LFT in 2 weeks. Follow up in office in 4 weeks.  Also check EBV, CMV. Will need to consider liver biopsy if LFT not improving. Thanks

## 2017-01-24 NOTE — Telephone Encounter (Signed)
I have spoken with the patient. She reports she stopped taking a supplement from [employer] that was calcium, magnesium, vitamin D and is "green based." She had "cool sculpting" earlier in the year. She takes daily Aleve for knee pain. She is on OTC Zantac 150 mg for indigestion. Agrees to come for EBV and CMV tomorrow. LFT's in 2 weeks. Follow up with you on 02/24/17 at 3:15 pm.  Please advise.

## 2017-01-24 NOTE — Telephone Encounter (Signed)
Please advise her to avoid NSAID's, herbal supplements. F/u EBV and CMV. Will hold off starting prednisone for now. Recheck LFT in 1-2 weeks. If AST/ALT not trending down, and all work up unrevealing will consider liver biopsy, will discuss at follow up visit. Thanks

## 2017-01-25 ENCOUNTER — Other Ambulatory Visit: Payer: Medicare Other

## 2017-01-25 DIAGNOSIS — R945 Abnormal results of liver function studies: Secondary | ICD-10-CM | POA: Diagnosis not present

## 2017-01-25 DIAGNOSIS — R7989 Other specified abnormal findings of blood chemistry: Secondary | ICD-10-CM

## 2017-01-25 NOTE — Addendum Note (Signed)
Addended by: Lamar Blinks C on: 01/25/2017 10:40 AM   Modules accepted: Orders

## 2017-01-27 ENCOUNTER — Encounter: Payer: Self-pay | Admitting: Family Medicine

## 2017-01-27 LAB — EPSTEIN-BARR VIRUS NUCLEAR ANTIGEN ANTIBODY, IGG: EBV NA IgG: >600 U/mL — ABNORMAL HIGH

## 2017-01-27 LAB — CYTOMEGALOVIRUS ANTIBODY, IGG: Cytomegalovirus Ab-IgG: >10 U/mL — ABNORMAL HIGH

## 2017-01-27 NOTE — Progress Notes (Signed)
See scanned paperwork

## 2017-01-31 ENCOUNTER — Other Ambulatory Visit: Payer: Self-pay

## 2017-01-31 DIAGNOSIS — R7989 Other specified abnormal findings of blood chemistry: Secondary | ICD-10-CM

## 2017-01-31 DIAGNOSIS — R945 Abnormal results of liver function studies: Secondary | ICD-10-CM

## 2017-02-01 ENCOUNTER — Telehealth: Payer: Self-pay | Admitting: Family Medicine

## 2017-02-01 NOTE — Telephone Encounter (Signed)
Copied from [facility]. Topic: Inquiry >> Feb 01, 2017 10:17 AM Pricilla Handler wrote: Reason for CRM: Nevin Bloodgood with [facility] [phone]) called wanting to speak with Dr. Lorelei Pont concerning this patient. Nevin Bloodgood needs a call back from Dr. Lorelei Pont today at [phone].       Thank You!!!  Dimitri Ped back and spoke with her- they think that Leasha may have had recent CMV, and they are holding off on a biopsy for a few weeks

## 2017-02-02 ENCOUNTER — Telehealth: Payer: Self-pay | Admitting: Gastroenterology

## 2017-02-02 NOTE — Telephone Encounter (Signed)
Patient is advised.  

## 2017-02-02 NOTE — Telephone Encounter (Signed)
Dr Silverio Decamp is it okay for all the labs to be drawn at once next week? She will be getting repeat LFT's, the CMV and EBV.

## 2017-02-02 NOTE — Telephone Encounter (Signed)
Ok to do it together. Thanks

## 2017-02-07 ENCOUNTER — Other Ambulatory Visit (INDEPENDENT_AMBULATORY_CARE_PROVIDER_SITE_OTHER): Payer: Medicare Other

## 2017-02-07 DIAGNOSIS — R7989 Other specified abnormal findings of blood chemistry: Secondary | ICD-10-CM

## 2017-02-07 DIAGNOSIS — R945 Abnormal results of liver function studies: Secondary | ICD-10-CM

## 2017-02-07 LAB — HEPATIC FUNCTION PANEL
ALBUMIN: 4.4 g/dL (ref 3.5–5.2)
ALT: 133 U/L — ABNORMAL HIGH (ref 0–35)
AST: 103 U/L — ABNORMAL HIGH (ref 0–37)
Alkaline Phosphatase: 68 U/L (ref 39–117)
Bilirubin, Direct: 0.1 mg/dL (ref 0.0–0.3)
TOTAL PROTEIN: 7.3 g/dL (ref 6.0–8.3)
Total Bilirubin: 0.8 mg/dL (ref 0.2–1.2)

## 2017-02-09 LAB — CYTOMEGALOVIRUS PCR, QUALITATIVE: CMV DNA, Qual PCR: NOT DETECTED

## 2017-02-11 LAB — EPSTEIN BARR VRS(EBV DNA BY PCR): EPSTEIN-BARR DNA QUANT, PCR: NEGATIVE {copies}/mL

## 2017-02-21 DIAGNOSIS — M2550 Pain in unspecified joint: Secondary | ICD-10-CM | POA: Diagnosis not present

## 2017-02-21 DIAGNOSIS — R768 Other specified abnormal immunological findings in serum: Secondary | ICD-10-CM | POA: Diagnosis not present

## 2017-02-21 DIAGNOSIS — R945 Abnormal results of liver function studies: Secondary | ICD-10-CM | POA: Diagnosis not present

## 2017-02-24 ENCOUNTER — Ambulatory Visit (INDEPENDENT_AMBULATORY_CARE_PROVIDER_SITE_OTHER): Payer: Medicare Other | Admitting: Gastroenterology

## 2017-02-24 ENCOUNTER — Encounter: Payer: Self-pay | Admitting: Gastroenterology

## 2017-02-24 VITALS — BP 124/86 | HR 82 | Ht 64.0 in | Wt 186.5 lb

## 2017-02-24 DIAGNOSIS — K760 Fatty (change of) liver, not elsewhere classified: Secondary | ICD-10-CM

## 2017-02-24 DIAGNOSIS — R768 Other specified abnormal immunological findings in serum: Secondary | ICD-10-CM | POA: Diagnosis not present

## 2017-02-24 DIAGNOSIS — R945 Abnormal results of liver function studies: Secondary | ICD-10-CM | POA: Diagnosis not present

## 2017-02-24 DIAGNOSIS — R7989 Other specified abnormal findings of blood chemistry: Secondary | ICD-10-CM

## 2017-02-24 NOTE — Patient Instructions (Signed)
You have been scheduled for an abdominal ultrasound at [facility]  (1st floor of hospital) on 03/01/2017 at 9:30am. Please arrive 15 minutes prior to your appointment for registration. Make certain not to have anything to eat or drink 6 hours prior to your appointment. Should you need to reschedule your appointment, please contact radiology at [phone]. This test typically takes about 30 minutes to perform  You will need follow up labs in 1 month  (CMET)  Follow up in 4 months .

## 2017-02-27 NOTE — Progress Notes (Signed)
Misty Rivas    [id number]    Mar 26, 1945  Primary Care Physician:Copland, Gay Filler, MD  Referring Physician: Darreld Mclean, [address] [place], [place] [address]  Chief complaint: Abnormal LFT  HPI: 69 yr F here for follow up visit for abnormal LFT. She was last seen by Tye Savoy in 12/14/16. Work up so far  Negative for viral hepatitis. ANA is positive 1:160 homogenous consistent with lupus but doesn't have elevated ESR (ESR normal at 4 and CRP <0.2), No hyperglobulinemia and specific markers for autoimmune hepatitis are negative. Normal iron sat and slightly elevated ferritin 366.  AST 103 and ALT 133 are trending down from peak 157 and 146 respectively  She did Cool Sculpting or cryolipolysis last summer for weight loss, she did multiple sessions, before the AST and ALT were noted to rise. Denies any antibiotics or chane in medications. No OTC supplements or herbal remedies. She drinks ETOH only occasionally.  We don't have historic data for LFT, available only since May 2018. Patient thinks she was informed of fatty liver in the past many years ago.  Denies any skin rash, small joint arthritis, nausea, vomiting, abdominal pain, melena or bright red blood per rectum. She has knee and hip osteoarthritis.    Outpatient Encounter Medications as of 02/24/2017  Medication Sig  . amLODipine (NORVASC) 5 MG tablet Take 1 tablet (5 mg total) daily by mouth.  . Calcium Carb-Cholecalciferol (CALCIUM-VITAMIN D) 500-200 MG-UNIT tablet Take by mouth.  . escitalopram (LEXAPRO) 20 MG tablet escitalopram 20 mg tablet  . metoprolol tartrate (LOPRESSOR) 50 MG tablet Take 0.5 tablets (25 mg total) by mouth 2 (two) times daily.  . Multiple Vitamin (MULTI-VITAMINS) TABS Take by mouth.  . naproxen sodium (ALEVE) 220 MG tablet Take 220 mg by mouth as needed.    No facility-administered encounter medications on file as of 02/24/2017.     Allergies as of 02/24/2017  - Review Complete 02/24/2017  Allergen Reaction Noted  . Lisinopril Other (See Comments) 05/20/2016  . Sulfa antibiotics Other (See Comments) and Rash 10/16/2012    Past Medical History:  Diagnosis Date  . Acid reflux   . Arthritis   . Degenerative joint disease of knee   . Fibromyalgia   . Hiatal hernia     Past Surgical History:  Procedure Laterality Date  . CESAREAN SECTION    . CHOLECYSTECTOMY    . TONSILLECTOMY  1955    Family History  Problem Relation Age of Onset  . Mental illness Mother   . Heart disease Father   . Prostate cancer Father   . Breast cancer Sister   . Colon cancer Neg Hx   . Stomach cancer Neg Hx   . Rectal cancer Neg Hx   . Esophageal cancer Neg Hx   . Liver cancer Neg Hx     Social History   Socioeconomic History  . Marital status: Married    Spouse name: Not on file  . Number of children: 2  . Years of education: Not on file  . Highest education level: Not on file  Social Needs  . Financial resource strain: Not on file  . Food insecurity - worry: Not on file  . Food insecurity - inability: Not on file  . Transportation needs - medical: Not on file  . Transportation needs - non-medical: Not on file  Occupational History  . Occupation: retired  Tobacco Use  .  Smoking status: Never Smoker  . Smokeless tobacco: Never Used  Substance and Sexual Activity  . Alcohol use: Yes    Frequency: Never    Comment: occasional once monthly  . Drug use: No  . Sexual activity: No  Other Topics Concern  . Not on file  Social History Narrative  . Not on file      Review of systems: Review of Systems  Constitutional: Negative for fever and chills.  HENT: Negative.   Eyes: Negative for blurred vision.  Respiratory: Negative for cough, shortness of breath and wheezing.   Cardiovascular: Negative for chest pain and palpitations.  Gastrointestinal: as per HPI Genitourinary: Negative for dysuria, urgency, frequency and hematuria.    Musculoskeletal: Positive for myalgias, back pain and joint pain.  Skin: Negative for itching and rash.  Neurological: Negative for dizziness, tremors, focal weakness, seizures and loss of consciousness.  Endo/Heme/Allergies: Positive for seasonal allergies.  Psychiatric/Behavioral: Negative for depression, suicidal ideas and hallucinations.  All other systems reviewed and are negative.   Physical Exam: Vitals:   02/24/17 1454  BP: 124/86  Pulse: 82   Body mass index is 32.01 kg/m. Gen:      No acute distress HEENT:  EOMI, sclera anicteric Neck:     No masses; no thyromegaly Lungs:    Clear to auscultation bilaterally; normal respiratory effort CV:         Regular rate and rhythm; no murmurs Abd:      + bowel sounds; soft, non-tender; no palpable masses, no distension Ext:    No edema; adequate peripheral perfusion Skin:      Warm and dry; no rash Neuro: alert and oriented x 3 Psych: normal mood and affect  Data Reviewed:  Reviewed labs, radiology imaging, old records and pertinent past GI work up   Assessment and Plan/Recommendations:  31 yr F with obesity, osteoarthritis here for follow up of abnormal LFT Though has elevated ANA 1:160, lupus pattern, doesn't meet criteria for autoimmune hepatitis. Patient doesn't want to undergo invasive liver biopsy, agree with holding off and monitoring. AST and ALT are down trending. If they start rising again , will re discuss with patient Unclear if cryolipolysis contributed to the the LFT abnormlaity? vs drug induced liver injury vs NASH Avoid ETOH, NSAID's and OTC herbal supplements Will obtain abdominal ultrasound Repeat CMP in 1 month Return in 3-4 months   25 minutes was spent face-to-face with the patient. Greater than 50% of the time used for counseling as well as treatment plan and follow-up. She had multiple questions which were answered to her satisfaction  K. Denzil Magnuson , MD [phone] Mon-Fri 8a-5p [phone] after  5p, weekends, holidays  CC: Copland, Gay Filler, MD

## 2017-03-01 ENCOUNTER — Ambulatory Visit (HOSPITAL_BASED_OUTPATIENT_CLINIC_OR_DEPARTMENT_OTHER)
Admission: RE | Admit: 2017-03-01 | Discharge: 2017-03-01 | Disposition: A | Discharge status: Home / Self Care | Payer: Medicare Other | Source: Ambulatory Visit | Attending: Gastroenterology | Admitting: Gastroenterology

## 2017-03-01 DIAGNOSIS — R945 Abnormal results of liver function studies: Secondary | ICD-10-CM | POA: Diagnosis not present

## 2017-03-01 DIAGNOSIS — Z9049 Acquired absence of other specified parts of digestive tract: Secondary | ICD-10-CM | POA: Diagnosis not present

## 2017-03-01 DIAGNOSIS — K760 Fatty (change of) liver, not elsewhere classified: Secondary | ICD-10-CM | POA: Diagnosis not present

## 2017-03-01 DIAGNOSIS — Z1231 Encounter for screening mammogram for malignant neoplasm of breast: Secondary | ICD-10-CM | POA: Diagnosis not present

## 2017-03-01 DIAGNOSIS — R7989 Other specified abnormal findings of blood chemistry: Secondary | ICD-10-CM

## 2017-03-10 DIAGNOSIS — S90212A Contusion of left great toe with damage to nail, initial encounter: Secondary | ICD-10-CM | POA: Diagnosis not present

## 2017-03-13 DIAGNOSIS — M79675 Pain in left toe(s): Secondary | ICD-10-CM | POA: Diagnosis not present

## 2017-03-13 DIAGNOSIS — M7989 Other specified soft tissue disorders: Secondary | ICD-10-CM | POA: Diagnosis not present

## 2017-03-13 DIAGNOSIS — S90212D Contusion of left great toe with damage to nail, subsequent encounter: Secondary | ICD-10-CM | POA: Diagnosis not present

## 2017-03-20 DIAGNOSIS — M7989 Other specified soft tissue disorders: Secondary | ICD-10-CM | POA: Diagnosis not present

## 2017-03-20 DIAGNOSIS — S90212D Contusion of left great toe with damage to nail, subsequent encounter: Secondary | ICD-10-CM | POA: Diagnosis not present

## 2017-03-20 DIAGNOSIS — M79675 Pain in left toe(s): Secondary | ICD-10-CM | POA: Diagnosis not present

## 2017-03-29 DIAGNOSIS — Z961 Presence of intraocular lens: Secondary | ICD-10-CM | POA: Diagnosis not present

## 2017-03-29 DIAGNOSIS — H40023 Open angle with borderline findings, high risk, bilateral: Secondary | ICD-10-CM | POA: Diagnosis not present

## 2017-03-29 DIAGNOSIS — H43393 Other vitreous opacities, bilateral: Secondary | ICD-10-CM | POA: Diagnosis not present

## 2017-03-29 DIAGNOSIS — H524 Presbyopia: Secondary | ICD-10-CM | POA: Diagnosis not present

## 2017-03-29 DIAGNOSIS — H52203 Unspecified astigmatism, bilateral: Secondary | ICD-10-CM | POA: Diagnosis not present

## 2017-03-29 DIAGNOSIS — H04123 Dry eye syndrome of bilateral lacrimal glands: Secondary | ICD-10-CM | POA: Diagnosis not present

## 2017-04-01 ENCOUNTER — Telehealth: Payer: Self-pay | Admitting: Family Medicine

## 2017-04-01 DIAGNOSIS — I10 Essential (primary) hypertension: Secondary | ICD-10-CM

## 2017-04-01 MED ORDER — METOPROLOL TARTRATE 25 MG PO TABS: 25.0000 mg | ORAL_TABLET | Freq: Two times a day (BID) | ORAL | 3 refills | Status: DC

## 2017-04-01 NOTE — Telephone Encounter (Signed)
-----   Message from Emi Holes, [place] sent at 03/31/2017 11:00 AM EDT ----- Regarding: Metoprolol dose change  Received fax from [facility] that states "Patient requesting new rx: Patient would like to have metoprolol 25 mg instead of 50 mg in order to not confuse other meds with rx."  Please advise.

## 2017-04-01 NOTE — Telephone Encounter (Signed)
Ok, done 

## 2017-04-04 ENCOUNTER — Other Ambulatory Visit (INDEPENDENT_AMBULATORY_CARE_PROVIDER_SITE_OTHER): Payer: Medicare Other

## 2017-04-04 DIAGNOSIS — R945 Abnormal results of liver function studies: Secondary | ICD-10-CM | POA: Diagnosis not present

## 2017-04-04 DIAGNOSIS — K760 Fatty (change of) liver, not elsewhere classified: Secondary | ICD-10-CM | POA: Diagnosis not present

## 2017-04-04 DIAGNOSIS — R7989 Other specified abnormal findings of blood chemistry: Secondary | ICD-10-CM

## 2017-04-04 LAB — COMPREHENSIVE METABOLIC PANEL
ALT: 102 U/L — AB (ref 0–35)
AST: 96 U/L — ABNORMAL HIGH (ref 0–37)
Albumin: 4.2 g/dL (ref 3.5–5.2)
Alkaline Phosphatase: 73 U/L (ref 39–117)
BUN: 12 mg/dL (ref 6–23)
CHLORIDE: 104 meq/L (ref 96–112)
CO2: 27 mEq/L (ref 19–32)
Calcium: 10.1 mg/dL (ref 8.4–10.5)
Creatinine, Ser: 0.7 mg/dL (ref 0.40–1.20)
GFR: 87.84 mL/min (ref >60.00)
GLUCOSE: 104 mg/dL — AB (ref 70–99)
POTASSIUM: 4 meq/L (ref 3.5–5.1)
Sodium: 139 mEq/L (ref 135–145)
Total Bilirubin: 1 mg/dL (ref 0.2–1.2)
Total Protein: 7.3 g/dL (ref 6.0–8.3)

## 2017-04-05 ENCOUNTER — Telehealth: Payer: Self-pay | Admitting: Gastroenterology

## 2017-04-05 DIAGNOSIS — M76891 Other specified enthesopathies of right lower limb, excluding foot: Secondary | ICD-10-CM | POA: Diagnosis not present

## 2017-04-05 DIAGNOSIS — M25551 Pain in right hip: Secondary | ICD-10-CM | POA: Diagnosis not present

## 2017-04-05 DIAGNOSIS — M5136 Other intervertebral disc degeneration, lumbar region: Secondary | ICD-10-CM | POA: Diagnosis not present

## 2017-04-05 NOTE — Telephone Encounter (Signed)
Informed patient Dr Jillyn Hidden recommendations  She will try to use sparingly for tendonitis

## 2017-04-05 NOTE — Telephone Encounter (Signed)
If she can avoid NSAID's its better, please advise her to use very sparingly .

## 2017-04-05 NOTE — Telephone Encounter (Signed)
Dr Silverio Decamp Please advise

## 2017-04-21 DIAGNOSIS — M25551 Pain in right hip: Secondary | ICD-10-CM | POA: Diagnosis not present

## 2017-04-21 DIAGNOSIS — M25562 Pain in left knee: Secondary | ICD-10-CM | POA: Diagnosis not present

## 2017-04-21 DIAGNOSIS — G8929 Other chronic pain: Secondary | ICD-10-CM | POA: Diagnosis not present

## 2017-04-21 DIAGNOSIS — M545 Low back pain: Secondary | ICD-10-CM | POA: Diagnosis not present

## 2017-04-21 DIAGNOSIS — M76891 Other specified enthesopathies of right lower limb, excluding foot: Secondary | ICD-10-CM | POA: Diagnosis not present

## 2017-04-26 DIAGNOSIS — M76891 Other specified enthesopathies of right lower limb, excluding foot: Secondary | ICD-10-CM | POA: Diagnosis not present

## 2017-04-26 DIAGNOSIS — M25551 Pain in right hip: Secondary | ICD-10-CM | POA: Diagnosis not present

## 2017-04-26 DIAGNOSIS — M545 Low back pain: Secondary | ICD-10-CM | POA: Diagnosis not present

## 2017-04-26 DIAGNOSIS — M25562 Pain in left knee: Secondary | ICD-10-CM | POA: Diagnosis not present

## 2017-04-26 DIAGNOSIS — G8929 Other chronic pain: Secondary | ICD-10-CM | POA: Diagnosis not present

## 2017-05-02 DIAGNOSIS — M545 Low back pain: Secondary | ICD-10-CM | POA: Diagnosis not present

## 2017-05-02 DIAGNOSIS — M76891 Other specified enthesopathies of right lower limb, excluding foot: Secondary | ICD-10-CM | POA: Diagnosis not present

## 2017-05-02 DIAGNOSIS — M25562 Pain in left knee: Secondary | ICD-10-CM | POA: Diagnosis not present

## 2017-05-02 DIAGNOSIS — G8929 Other chronic pain: Secondary | ICD-10-CM | POA: Diagnosis not present

## 2017-05-04 DIAGNOSIS — M25562 Pain in left knee: Secondary | ICD-10-CM | POA: Diagnosis not present

## 2017-05-04 DIAGNOSIS — M545 Low back pain: Secondary | ICD-10-CM | POA: Diagnosis not present

## 2017-05-04 DIAGNOSIS — G8929 Other chronic pain: Secondary | ICD-10-CM | POA: Diagnosis not present

## 2017-05-04 DIAGNOSIS — M76891 Other specified enthesopathies of right lower limb, excluding foot: Secondary | ICD-10-CM | POA: Diagnosis not present

## 2017-05-09 DIAGNOSIS — M25551 Pain in right hip: Secondary | ICD-10-CM | POA: Diagnosis not present

## 2017-05-09 DIAGNOSIS — H0012 Chalazion right lower eyelid: Secondary | ICD-10-CM | POA: Diagnosis not present

## 2017-05-09 DIAGNOSIS — M545 Low back pain: Secondary | ICD-10-CM | POA: Diagnosis not present

## 2017-05-09 DIAGNOSIS — M76891 Other specified enthesopathies of right lower limb, excluding foot: Secondary | ICD-10-CM | POA: Diagnosis not present

## 2017-05-09 DIAGNOSIS — G8929 Other chronic pain: Secondary | ICD-10-CM | POA: Diagnosis not present

## 2017-05-10 DIAGNOSIS — M25562 Pain in left knee: Secondary | ICD-10-CM | POA: Diagnosis not present

## 2017-05-10 DIAGNOSIS — R799 Abnormal finding of blood chemistry, unspecified: Secondary | ICD-10-CM | POA: Diagnosis not present

## 2017-05-10 DIAGNOSIS — M1712 Unilateral primary osteoarthritis, left knee: Secondary | ICD-10-CM | POA: Diagnosis not present

## 2017-05-16 DIAGNOSIS — M1712 Unilateral primary osteoarthritis, left knee: Secondary | ICD-10-CM | POA: Diagnosis not present

## 2017-05-16 DIAGNOSIS — M25562 Pain in left knee: Secondary | ICD-10-CM | POA: Diagnosis not present

## 2017-05-16 DIAGNOSIS — M545 Low back pain: Secondary | ICD-10-CM | POA: Diagnosis not present

## 2017-05-16 DIAGNOSIS — M76891 Other specified enthesopathies of right lower limb, excluding foot: Secondary | ICD-10-CM | POA: Diagnosis not present

## 2017-05-16 DIAGNOSIS — G8929 Other chronic pain: Secondary | ICD-10-CM | POA: Diagnosis not present

## 2017-05-19 DIAGNOSIS — M545 Low back pain: Secondary | ICD-10-CM | POA: Diagnosis not present

## 2017-05-19 DIAGNOSIS — G8929 Other chronic pain: Secondary | ICD-10-CM | POA: Diagnosis not present

## 2017-05-19 DIAGNOSIS — M76891 Other specified enthesopathies of right lower limb, excluding foot: Secondary | ICD-10-CM | POA: Diagnosis not present

## 2017-05-19 DIAGNOSIS — M25551 Pain in right hip: Secondary | ICD-10-CM | POA: Diagnosis not present

## 2017-05-23 DIAGNOSIS — M545 Low back pain: Secondary | ICD-10-CM | POA: Diagnosis not present

## 2017-05-23 DIAGNOSIS — G8929 Other chronic pain: Secondary | ICD-10-CM | POA: Diagnosis not present

## 2017-05-23 DIAGNOSIS — M76891 Other specified enthesopathies of right lower limb, excluding foot: Secondary | ICD-10-CM | POA: Diagnosis not present

## 2017-05-27 NOTE — Progress Notes (Addendum)
[place] at [facility] [address], [place], [place] [address] [phone]  Date:  05/29/2017   Name:  Misty Rivas   DOB:  [birth year]   MRN:  [id number]  PCP:  Darreld Mclean, MD    Chief Complaint: Ear Pain (behind left ear, headache, no trouble hearing, no fever-10 days)   History of Present Illness:  Misty Rivas is a 72 y.o. very pleasant female patient who presents with the following:  Generally healthy older lady here today with concern of a pain behind her left ear   She is seeing rheumatology per [facility] but it looks like they think her positive ANA is due to her liver  Per most recent GI note from February: 51 yr F with obesity, osteoarthritis here for follow up of abnormal LFT Though has elevated ANA 1:160, lupus pattern, doesn't meet criteria for autoimmune hepatitis. Patient doesn't want to undergo invasive liver biopsy, agree with holding off and monitoring. AST and ALT are down trending. If they start rising again , will re discuss with patient Unclear if cryolipolysis contributed to the the LFT abnormlaity? vs drug induced liver injury vs NASH Avoid ETOH, NSAID's and OTC herbal supplements Will obtain abdominal ultrasound Repeat CMP in 1 month Return in 3-4 months  She is getting a knee replacement in about 3 weeks She has noted a pain behind her left ear for about 10 days- she was not that worried about it but does not want anything to interfere with her upcoming operations  She has noted some headache/ itching on her left scalp No hearing difficulty noted No fever No cough or ST She is working outdoors a good bit  She does have some sneezing   Otherwise she is feeling well  Patient Active Problem List   Diagnosis Date Noted  . Essential hypertension 11/23/2016  . Estrogen deficiency 11/23/2016    Past Medical History:  Diagnosis Date  . Acid reflux   . Arthritis   . Degenerative joint disease  of knee   . Fibromyalgia   . Hiatal hernia     Past Surgical History:  Procedure Laterality Date  . CESAREAN SECTION    . CHOLECYSTECTOMY    . TONSILLECTOMY  1955    Social History   Tobacco Use  . Smoking status: Never Smoker  . Smokeless tobacco: Never Used  Substance Use Topics  . Alcohol use: Yes    Frequency: Never    Comment: occasional once monthly  . Drug use: No    Family History  Problem Relation Age of Onset  . Mental illness Mother   . Heart disease Father   . Prostate cancer Father   . Breast cancer Sister   . Colon cancer Neg Hx   . Stomach cancer Neg Hx   . Rectal cancer Neg Hx   . Esophageal cancer Neg Hx   . Liver cancer Neg Hx     Allergies  Allergen Reactions  . Lisinopril Other (See Comments)    Per pt cannot tolerate medication makes her fatigued  . Sulfa Antibiotics Other (See Comments) and Rash    Other reaction(s): Other (See Comments) Unknown reaction per pt, hasnt taken in a long time. Unknown reaction per pt, hasnt taken in a long time.     Medication list has been reviewed and updated.  Current Outpatient Medications on File Prior to Visit  Medication Sig Dispense Refill  . amLODipine (NORVASC) 5  MG tablet Take 1 tablet (5 mg total) daily by mouth. 90 tablet 3  . Calcium Carb-Cholecalciferol (CALCIUM-VITAMIN D) 500-200 MG-UNIT tablet Take by mouth.    . escitalopram (LEXAPRO) 20 MG tablet escitalopram 20 mg tablet    . metoprolol tartrate (LOPRESSOR) 25 MG tablet Take 1 tablet (25 mg total) by mouth 2 (two) times daily. 180 tablet 3  . Multiple Vitamin (MULTI-VITAMINS) TABS Take by mouth.    . naproxen sodium (ALEVE) 220 MG tablet Take 220 mg by mouth as needed.      No current facility-administered medications on file prior to visit.     Review of Systems:  As per HPI- otherwise negative. No sx of her eyes or the end of her nose   Physical Examination: Vitals:   05/29/17 1201  BP: 130/80  Pulse: 61  Resp: 16  Temp:  (!) 97.4 F (36.3 C)  SpO2: 98%   Vitals:   05/29/17 1201  Weight: 185 lb (83.9 kg)  Height: 5' 4"  (1.626 m)   Body mass index is 31.76 kg/m. Ideal Body Weight: Weight in (lb) to have BMI = 25: 145.3  GEN: WDWN, NAD, Non-toxic, A & O x 3, overweight, looks well  HEENT: Atraumatic, Normocephalic. Neck supple. No masses, No LAD.  Bilateral TM wnl, oropharynx normal.  PEERL,EOMI.   Just behind her LEFT ear are 2 skin lesions which appear most c/w resolving vesicular lesions.   Ear canal is normal  Ears and Nose: No external deformity. CV: RRR, No M/G/R. No JVD. No thrill. No extra heart sounds. PULM: CTA B, no wheezes, crackles, rhonchi. No retractions. No resp. distress. No accessory muscle use. EXTR: No c/c/e NEURO Normal gait.  PSYCH: Normally interactive. Conversant. Not depressed or anxious appearing.  Calm demeanor.    Assessment and Plan: HSV (herpes simplex virus) infection - Plan: valACYclovir (VALTREX) 1000 MG tablet  Elevated LFTs - Plan: Hepatic function panel  Suspect mild shingles as the cause of her sx  Discussed with pt including risk of contagion to zoster naive persons Valtrex rx Repeat LFTS today to trend As per HPI- otherwise negative.   Signed Lamar Blinks, MD  Received her labs, message to pt   Results for orders placed or performed in visit on 05/29/17  Hepatic function panel  Result Value Ref Range   Total Bilirubin 0.6 0.2 - 1.2 mg/dL   Bilirubin, Direct 0.1 0.0 - 0.3 mg/dL   Alkaline Phosphatase 66 39 - 117 U/L   AST 91 (H) 0 - 37 U/L   ALT 107 (H) 0 - 35 U/L   Total Protein 6.7 6.0 - 8.3 g/dL   Albumin 4.2 3.5 - 5.2 g/dL  ]

## 2017-05-29 ENCOUNTER — Ambulatory Visit (INDEPENDENT_AMBULATORY_CARE_PROVIDER_SITE_OTHER): Payer: Medicare Other | Admitting: Family Medicine

## 2017-05-29 ENCOUNTER — Encounter: Payer: Self-pay | Admitting: Family Medicine

## 2017-05-29 VITALS — BP 130/80 | HR 61 | Temp 97.4°F | Resp 16 | Ht 64.0 in | Wt 185.0 lb

## 2017-05-29 DIAGNOSIS — B009 Herpesviral infection, unspecified: Secondary | ICD-10-CM

## 2017-05-29 DIAGNOSIS — R945 Abnormal results of liver function studies: Secondary | ICD-10-CM

## 2017-05-29 DIAGNOSIS — R7989 Other specified abnormal findings of blood chemistry: Secondary | ICD-10-CM

## 2017-05-29 LAB — HEPATIC FUNCTION PANEL
ALK PHOS: 66 U/L (ref 39–117)
ALT: 107 U/L — ABNORMAL HIGH (ref 0–35)
AST: 91 U/L — ABNORMAL HIGH (ref 0–37)
Albumin: 4.2 g/dL (ref 3.5–5.2)
BILIRUBIN DIRECT: 0.1 mg/dL (ref 0.0–0.3)
TOTAL PROTEIN: 6.7 g/dL (ref 6.0–8.3)
Total Bilirubin: 0.6 mg/dL (ref 0.2–1.2)

## 2017-05-29 MED ORDER — VALACYCLOVIR HCL 1 G PO TABS: 1000.0000 mg | ORAL_TABLET | Freq: Three times a day (TID) | ORAL | 0 refills | Status: DC

## 2017-05-29 NOTE — Patient Instructions (Signed)
I think you have mild shingles on your right scalp Use the valtrex as directed Otherwise this appears to be quite a mild case and I think you will do well Let me know if your skin lesions do not heal up in a week or so, or if you continue to have any other symptoms Take care!

## 2017-05-31 DIAGNOSIS — M1712 Unilateral primary osteoarthritis, left knee: Secondary | ICD-10-CM | POA: Diagnosis not present

## 2017-05-31 DIAGNOSIS — Z01818 Encounter for other preprocedural examination: Secondary | ICD-10-CM | POA: Diagnosis not present

## 2017-06-06 DIAGNOSIS — M76891 Other specified enthesopathies of right lower limb, excluding foot: Secondary | ICD-10-CM | POA: Diagnosis not present

## 2017-06-06 DIAGNOSIS — M545 Low back pain: Secondary | ICD-10-CM | POA: Diagnosis not present

## 2017-06-06 DIAGNOSIS — G8929 Other chronic pain: Secondary | ICD-10-CM | POA: Diagnosis not present

## 2017-06-09 DIAGNOSIS — M76891 Other specified enthesopathies of right lower limb, excluding foot: Secondary | ICD-10-CM | POA: Diagnosis not present

## 2017-06-19 DIAGNOSIS — K219 Gastro-esophageal reflux disease without esophagitis: Secondary | ICD-10-CM | POA: Diagnosis present

## 2017-06-19 DIAGNOSIS — I10 Essential (primary) hypertension: Secondary | ICD-10-CM | POA: Diagnosis present

## 2017-06-19 DIAGNOSIS — Z96652 Presence of left artificial knee joint: Secondary | ICD-10-CM | POA: Diagnosis not present

## 2017-06-19 DIAGNOSIS — M1712 Unilateral primary osteoarthritis, left knee: Secondary | ICD-10-CM | POA: Diagnosis present

## 2017-06-19 DIAGNOSIS — G4733 Obstructive sleep apnea (adult) (pediatric): Secondary | ICD-10-CM | POA: Diagnosis present

## 2017-06-19 DIAGNOSIS — F329 Major depressive disorder, single episode, unspecified: Secondary | ICD-10-CM | POA: Diagnosis not present

## 2017-06-19 DIAGNOSIS — Z471 Aftercare following joint replacement surgery: Secondary | ICD-10-CM | POA: Diagnosis not present

## 2017-06-19 DIAGNOSIS — F3342 Major depressive disorder, recurrent, in full remission: Secondary | ICD-10-CM | POA: Diagnosis present

## 2017-06-19 DIAGNOSIS — G8918 Other acute postprocedural pain: Secondary | ICD-10-CM | POA: Diagnosis not present

## 2017-06-19 HISTORY — PX: TOTAL KNEE ARTHROPLASTY: SHX125

## 2017-06-20 MED ORDER — DIPHENHYDRAMINE HCL 50 MG/ML IJ SOLN
12.50 | INTRAMUSCULAR | Status: DC
Start: ? — End: 2017-06-20

## 2017-06-20 MED ORDER — OXYCODONE HCL 15 MG PO TABS
7.50 | ORAL_TABLET | ORAL | Status: DC
Start: ? — End: 2017-06-20

## 2017-06-20 MED ORDER — LACTATED RINGERS IV SOLN
100.00 | INTRAVENOUS | Status: DC
Start: ? — End: 2017-06-20

## 2017-06-20 MED ORDER — SODIUM CHLORIDE 0.9 % IJ SOLN
10.00 | INTRAMUSCULAR | Status: DC
Start: 2017-06-20 — End: 2017-06-20

## 2017-06-20 MED ORDER — ACETAMINOPHEN 500 MG PO TABS
500.00 | ORAL_TABLET | ORAL | Status: DC
Start: 2017-06-20 — End: 2017-06-20

## 2017-06-20 MED ORDER — NALOXONE HCL 0.4 MG/ML IJ SOLN
0.10 | INTRAMUSCULAR | Status: DC
Start: ? — End: 2017-06-20

## 2017-06-20 MED ORDER — SENNOSIDES-DOCUSATE SODIUM 8.6-50 MG PO TABS
2.00 | ORAL_TABLET | ORAL | Status: DC
Start: 2017-06-20 — End: 2017-06-20

## 2017-06-20 MED ORDER — METOCLOPRAMIDE HCL 5 MG/ML IJ SOLN
10.00 | INTRAMUSCULAR | Status: DC
Start: ? — End: 2017-06-20

## 2017-06-20 MED ORDER — GENERIC EXTERNAL MEDICATION
12.50 | Status: DC
Start: ? — End: 2017-06-20

## 2017-06-20 MED ORDER — METOPROLOL TARTRATE 25 MG PO TABS
25.00 | ORAL_TABLET | ORAL | Status: DC
Start: 2017-06-20 — End: 2017-06-20

## 2017-06-20 MED ORDER — NALOXONE HCL 0.4 MG/ML IJ SOLN
0.40 | INTRAMUSCULAR | Status: DC
Start: ? — End: 2017-06-20

## 2017-06-20 MED ORDER — ASPIRIN EC 325 MG PO TBEC
325.00 | DELAYED_RELEASE_TABLET | ORAL | Status: DC
Start: 2017-06-20 — End: 2017-06-20

## 2017-06-20 MED ORDER — RANITIDINE HCL 150 MG PO TABS
150.00 | ORAL_TABLET | ORAL | Status: DC
Start: 2017-06-21 — End: 2017-06-20

## 2017-06-20 MED ORDER — GENERIC EXTERNAL MEDICATION
1.00 | Status: DC
Start: ? — End: 2017-06-20

## 2017-06-20 MED ORDER — GABAPENTIN 300 MG PO CAPS
300.00 | ORAL_CAPSULE | ORAL | Status: DC
Start: 2017-06-20 — End: 2017-06-20

## 2017-06-20 MED ORDER — MELOXICAM 7.5 MG PO TABS
7.50 | ORAL_TABLET | ORAL | Status: DC
Start: 2017-06-21 — End: 2017-06-20

## 2017-06-20 MED ORDER — BISACODYL 10 MG RE SUPP
10.00 | RECTAL | Status: DC
Start: ? — End: 2017-06-20

## 2017-06-20 MED ORDER — ESCITALOPRAM OXALATE 10 MG PO TABS
20.00 | ORAL_TABLET | ORAL | Status: DC
Start: 2017-06-21 — End: 2017-06-20

## 2017-06-20 MED ORDER — ONDANSETRON HCL 4 MG/2ML IJ SOLN
4.00 | INTRAMUSCULAR | Status: DC
Start: ? — End: 2017-06-20

## 2017-06-20 MED ORDER — AMLODIPINE BESYLATE 5 MG PO TABS
5.00 | ORAL_TABLET | ORAL | Status: DC
Start: 2017-06-21 — End: 2017-06-20

## 2017-06-20 MED ORDER — SODIUM CHLORIDE 0.9 % IJ SOLN
10.00 | INTRAMUSCULAR | Status: DC
Start: ? — End: 2017-06-20

## 2017-06-20 MED ORDER — LACTATED RINGERS IV SOLN
INTRAVENOUS | Status: DC
Start: ? — End: 2017-06-20

## 2017-06-20 MED ORDER — ALUMINUM-MAGNESIUM-SIMETHICONE 200-200-20 MG/5ML PO SUSP
30.00 | ORAL | Status: DC
Start: ? — End: 2017-06-20

## 2017-06-20 MED ORDER — METOCLOPRAMIDE HCL 10 MG PO TABS
10.00 | ORAL_TABLET | ORAL | Status: DC
Start: ? — End: 2017-06-20

## 2017-06-20 MED ORDER — OXYCODONE HCL 5 MG PO TABS
10.00 | ORAL_TABLET | ORAL | Status: DC
Start: ? — End: 2017-06-20

## 2017-06-22 DIAGNOSIS — Z96652 Presence of left artificial knee joint: Secondary | ICD-10-CM | POA: Diagnosis not present

## 2017-06-22 DIAGNOSIS — Z471 Aftercare following joint replacement surgery: Secondary | ICD-10-CM | POA: Diagnosis not present

## 2017-06-26 DIAGNOSIS — Z471 Aftercare following joint replacement surgery: Secondary | ICD-10-CM | POA: Diagnosis not present

## 2017-06-26 DIAGNOSIS — Z96652 Presence of left artificial knee joint: Secondary | ICD-10-CM | POA: Diagnosis not present

## 2017-06-30 DIAGNOSIS — Z96652 Presence of left artificial knee joint: Secondary | ICD-10-CM | POA: Diagnosis not present

## 2017-06-30 DIAGNOSIS — Z471 Aftercare following joint replacement surgery: Secondary | ICD-10-CM | POA: Diagnosis not present

## 2017-07-03 DIAGNOSIS — Z96652 Presence of left artificial knee joint: Secondary | ICD-10-CM | POA: Diagnosis not present

## 2017-07-03 DIAGNOSIS — Z471 Aftercare following joint replacement surgery: Secondary | ICD-10-CM | POA: Diagnosis not present

## 2017-07-05 DIAGNOSIS — Z96652 Presence of left artificial knee joint: Secondary | ICD-10-CM | POA: Diagnosis not present

## 2017-07-05 DIAGNOSIS — Z471 Aftercare following joint replacement surgery: Secondary | ICD-10-CM | POA: Diagnosis not present

## 2017-07-06 DIAGNOSIS — Z96652 Presence of left artificial knee joint: Secondary | ICD-10-CM | POA: Diagnosis not present

## 2017-07-06 DIAGNOSIS — Z471 Aftercare following joint replacement surgery: Secondary | ICD-10-CM | POA: Diagnosis not present

## 2017-07-06 DIAGNOSIS — Z7982 Long term (current) use of aspirin: Secondary | ICD-10-CM | POA: Diagnosis not present

## 2017-07-10 DIAGNOSIS — Z96652 Presence of left artificial knee joint: Secondary | ICD-10-CM | POA: Diagnosis not present

## 2017-07-10 DIAGNOSIS — Z471 Aftercare following joint replacement surgery: Secondary | ICD-10-CM | POA: Diagnosis not present

## 2017-07-12 DIAGNOSIS — Z96652 Presence of left artificial knee joint: Secondary | ICD-10-CM | POA: Diagnosis not present

## 2017-07-12 DIAGNOSIS — Z471 Aftercare following joint replacement surgery: Secondary | ICD-10-CM | POA: Diagnosis not present

## 2017-07-17 DIAGNOSIS — Z96652 Presence of left artificial knee joint: Secondary | ICD-10-CM | POA: Diagnosis not present

## 2017-07-17 DIAGNOSIS — Z471 Aftercare following joint replacement surgery: Secondary | ICD-10-CM | POA: Diagnosis not present

## 2017-07-20 DIAGNOSIS — Z96652 Presence of left artificial knee joint: Secondary | ICD-10-CM | POA: Diagnosis not present

## 2017-07-20 DIAGNOSIS — Z471 Aftercare following joint replacement surgery: Secondary | ICD-10-CM | POA: Diagnosis not present

## 2017-07-23 ENCOUNTER — Other Ambulatory Visit: Payer: Self-pay | Admitting: Family Medicine

## 2017-07-24 DIAGNOSIS — Z471 Aftercare following joint replacement surgery: Secondary | ICD-10-CM | POA: Diagnosis not present

## 2017-07-24 DIAGNOSIS — Z96652 Presence of left artificial knee joint: Secondary | ICD-10-CM | POA: Diagnosis not present

## 2017-07-27 DIAGNOSIS — Z471 Aftercare following joint replacement surgery: Secondary | ICD-10-CM | POA: Diagnosis not present

## 2017-07-27 DIAGNOSIS — Z96652 Presence of left artificial knee joint: Secondary | ICD-10-CM | POA: Diagnosis not present

## 2017-07-31 DIAGNOSIS — Z96652 Presence of left artificial knee joint: Secondary | ICD-10-CM | POA: Diagnosis not present

## 2017-07-31 DIAGNOSIS — Z471 Aftercare following joint replacement surgery: Secondary | ICD-10-CM | POA: Diagnosis not present

## 2017-08-02 DIAGNOSIS — Z471 Aftercare following joint replacement surgery: Secondary | ICD-10-CM | POA: Diagnosis not present

## 2017-08-02 DIAGNOSIS — Z96652 Presence of left artificial knee joint: Secondary | ICD-10-CM | POA: Diagnosis not present

## 2017-08-07 DIAGNOSIS — Z471 Aftercare following joint replacement surgery: Secondary | ICD-10-CM | POA: Diagnosis not present

## 2017-08-07 DIAGNOSIS — Z96652 Presence of left artificial knee joint: Secondary | ICD-10-CM | POA: Diagnosis not present

## 2017-08-10 DIAGNOSIS — Z96652 Presence of left artificial knee joint: Secondary | ICD-10-CM | POA: Diagnosis not present

## 2017-08-10 DIAGNOSIS — Z471 Aftercare following joint replacement surgery: Secondary | ICD-10-CM | POA: Diagnosis not present

## 2017-08-15 DIAGNOSIS — Z96652 Presence of left artificial knee joint: Secondary | ICD-10-CM | POA: Diagnosis not present

## 2017-08-15 DIAGNOSIS — Z471 Aftercare following joint replacement surgery: Secondary | ICD-10-CM | POA: Diagnosis not present

## 2017-08-17 DIAGNOSIS — Z471 Aftercare following joint replacement surgery: Secondary | ICD-10-CM | POA: Diagnosis not present

## 2017-08-17 DIAGNOSIS — Z96652 Presence of left artificial knee joint: Secondary | ICD-10-CM | POA: Diagnosis not present

## 2017-08-22 DIAGNOSIS — Z471 Aftercare following joint replacement surgery: Secondary | ICD-10-CM | POA: Diagnosis not present

## 2017-08-22 DIAGNOSIS — Z96652 Presence of left artificial knee joint: Secondary | ICD-10-CM | POA: Diagnosis not present

## 2017-08-24 DIAGNOSIS — Z96652 Presence of left artificial knee joint: Secondary | ICD-10-CM | POA: Diagnosis not present

## 2017-08-24 DIAGNOSIS — Z471 Aftercare following joint replacement surgery: Secondary | ICD-10-CM | POA: Diagnosis not present

## 2017-08-28 DIAGNOSIS — Z471 Aftercare following joint replacement surgery: Secondary | ICD-10-CM | POA: Diagnosis not present

## 2017-08-28 DIAGNOSIS — G8929 Other chronic pain: Secondary | ICD-10-CM | POA: Diagnosis not present

## 2017-08-28 DIAGNOSIS — M545 Low back pain: Secondary | ICD-10-CM | POA: Diagnosis not present

## 2017-08-28 DIAGNOSIS — Z96652 Presence of left artificial knee joint: Secondary | ICD-10-CM | POA: Diagnosis not present

## 2017-08-28 DIAGNOSIS — M76891 Other specified enthesopathies of right lower limb, excluding foot: Secondary | ICD-10-CM | POA: Diagnosis not present

## 2017-08-28 DIAGNOSIS — M25562 Pain in left knee: Secondary | ICD-10-CM | POA: Diagnosis not present

## 2017-08-28 DIAGNOSIS — M25551 Pain in right hip: Secondary | ICD-10-CM | POA: Diagnosis not present

## 2017-08-28 DIAGNOSIS — R262 Difficulty in walking, not elsewhere classified: Secondary | ICD-10-CM | POA: Diagnosis not present

## 2017-09-13 DIAGNOSIS — Z96652 Presence of left artificial knee joint: Secondary | ICD-10-CM | POA: Diagnosis not present

## 2017-09-13 DIAGNOSIS — Z471 Aftercare following joint replacement surgery: Secondary | ICD-10-CM | POA: Diagnosis not present

## 2017-10-03 DIAGNOSIS — H02831 Dermatochalasis of right upper eyelid: Secondary | ICD-10-CM | POA: Diagnosis not present

## 2017-10-03 DIAGNOSIS — H02834 Dermatochalasis of left upper eyelid: Secondary | ICD-10-CM | POA: Diagnosis not present

## 2017-10-03 DIAGNOSIS — H0100B Unspecified blepharitis left eye, upper and lower eyelids: Secondary | ICD-10-CM | POA: Diagnosis not present

## 2017-10-03 DIAGNOSIS — H0100A Unspecified blepharitis right eye, upper and lower eyelids: Secondary | ICD-10-CM | POA: Diagnosis not present

## 2017-10-03 DIAGNOSIS — H40003 Preglaucoma, unspecified, bilateral: Secondary | ICD-10-CM | POA: Diagnosis not present

## 2017-10-03 DIAGNOSIS — H04123 Dry eye syndrome of bilateral lacrimal glands: Secondary | ICD-10-CM | POA: Diagnosis not present

## 2017-10-11 DIAGNOSIS — Z471 Aftercare following joint replacement surgery: Secondary | ICD-10-CM | POA: Diagnosis not present

## 2017-10-11 DIAGNOSIS — Z96652 Presence of left artificial knee joint: Secondary | ICD-10-CM | POA: Diagnosis not present

## 2017-10-11 DIAGNOSIS — M25469 Effusion, unspecified knee: Secondary | ICD-10-CM | POA: Diagnosis not present

## 2017-10-20 DIAGNOSIS — M79662 Pain in left lower leg: Secondary | ICD-10-CM | POA: Diagnosis not present

## 2017-10-20 DIAGNOSIS — M7989 Other specified soft tissue disorders: Secondary | ICD-10-CM | POA: Diagnosis not present

## 2017-11-01 ENCOUNTER — Encounter: Payer: Self-pay | Admitting: Family Medicine

## 2017-11-01 ENCOUNTER — Telehealth: Payer: Self-pay | Admitting: Family Medicine

## 2017-11-01 DIAGNOSIS — R945 Abnormal results of liver function studies: Principal | ICD-10-CM

## 2017-11-01 DIAGNOSIS — R7989 Other specified abnormal findings of blood chemistry: Secondary | ICD-10-CM

## 2017-11-01 NOTE — Telephone Encounter (Signed)
Sure,ordered test [employer] to pt

## 2017-11-01 NOTE — Telephone Encounter (Signed)
Scheduled pt for AWV on 11/16/17. She would like to have her liver panel drawn again at this time. Per pt, last time it was check her levels were elevated.   Please advise

## 2017-11-15 NOTE — Progress Notes (Signed)
Subjective:   Misty Rivas is a 72 y.o. female who presents for an Initial Medicare Annual Wellness Visit.  Pt states she cares for husband that has alzheimer's.  Review of Systems   No ROS.  Medicare Wellness Visit. Additional risk factors are reflected in the social history. Cardiac Risk Factors include: advanced age (>67mn, >>66women);hypertension Sleep patterns: Sleep varies. Home Safety/Smoke Alarms: Feels safe in home. Smoke alarms in place. Lives with husband in 2 story home.   Female:       Mammo- last 02/2017 per pt.      Dexa scan- utd       CCS- last 11/05/13. Pt reports 5 yr recall Eye-pt reports she is utd every 6 months.    Objective:    Today's Vitals   11/16/17 1439  BP: 136/84  Pulse: (!) 58  SpO2: 97%  Weight: 185 lb 9.6 oz (84.2 kg)  Height: 5' 4"  (1.626 m)   Body mass index is 31.86 kg/m.  Advanced Directives 11/16/2017  Does Patient Have a Medical Advance Directive? Yes  Type of AParamedicof [place]ing[place] will  Copy of HDonaldin Chart? No - copy requested    Current Medications (verified) Outpatient Encounter Medications as of 11/16/2017  Medication Sig  . amLODipine (NORVASC) 5 MG tablet Take 1 tablet (5 mg total) daily by mouth.  . Calcium Carb-Cholecalciferol (CALCIUM-VITAMIN D) 500-200 MG-UNIT tablet Take by mouth.  . escitalopram (LEXAPRO) 20 MG tablet TAKE 1 TABLET (20 MG TOTAL) BY MOUTH DAILY.  . metoprolol tartrate (LOPRESSOR) 25 MG tablet Take 1 tablet (25 mg total) by mouth 2 (two) times daily.  . Multiple Vitamin (MULTI-VITAMINS) TABS Take by mouth.  . naproxen sodium (ALEVE) 220 MG tablet Take 220 mg by mouth as needed.   . [DISCONTINUED] valACYclovir (VALTREX) 1000 MG tablet Take 1 tablet (1,000 mg total) by mouth 3 (three) times daily.   No facility-administered encounter medications on file as of 11/16/2017.     Allergies (verified) Lisinopril and Sulfa antibiotics    History: Past Medical History:  Diagnosis Date  . Acid reflux   . Arthritis   . Degenerative joint disease of knee   . Fibromyalgia   . Hiatal hernia    Past Surgical History:  Procedure Laterality Date  . CESAREAN SECTION    . CHOLECYSTECTOMY    . TONSILLECTOMY  1955  . 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 Mental illness Mother   . Heart disease Father   . Prostate cancer Father   . Breast cancer Sister   . Colon cancer Neg Hx   . Stomach cancer Neg Hx   . Rectal cancer Neg Hx   . Esophageal cancer Neg Hx   . Liver cancer Neg Hx    Social History   Socioeconomic History  . Marital status: Married    Spouse name: Not on file  . Number of children: 2  . Years of education: Not on file  . Highest education level: Not on file  Occupational History  . Occupation: retired  SScientific laboratory technician . Financial resource strain: Not on file  . Food insecurity:    Worry: Not on file    Inability: Not on file  . Transportation needs:    Medical: Not on file    Non-medical: Not on file  Tobacco Use  . Smoking status: Never Smoker  . Smokeless tobacco: Never  Used  Substance and Sexual Activity  . Alcohol use: Yes    Frequency: Never    Comment: occasional once monthly  . Drug use: No  . Sexual activity: Not Currently  Lifestyle  . Physical activity:    Days per week: Not on file    Minutes per session: Not on file  . Stress: Not on file  Relationships  . Social connections:    Talks on phone: Not on file    Gets together: Not on file    Attends religious service: Not on file    Active member of club or organization: Not on file    Attends meetings of clubs or organizations: Not on file    Relationship status: Not on file  Other Topics Concern  . Not on file  Social History Narrative  . Not on file    Tobacco Counseling Counseling given: Not Answered   Clinical Intake: Pain : No/denies  pain    Activities of Daily Living In your present state of health, do you have any difficulty performing the following activities: 11/16/2017 12/05/2016  Hearing? N N  Vision? N N  Difficulty concentrating or making decisions? N N  Walking or climbing stairs? N N  Dressing or bathing? N N  Doing errands, shopping? N N  Preparing Food and eating ? N -  Using the Toilet? N -  In the past six months, have you accidently leaked urine? N -  Do you have problems with loss of bowel control? N -  Managing your Medications? N -  Housekeeping or managing your Housekeeping? N -  Some recent data might be hidden     Immunizations and Health Maintenance Immunization History  Administered Date(s) Administered  . Pneumococcal Conjugate-13 11/27/2013  . Pneumococcal Polysaccharide-23 03/31/2010  . Tdap 12/23/2010  . Zoster 04/20/2009   Health Maintenance Due  Topic Date Due  . MAMMOGRAM  12/08/1996  . PNA vac Low Risk Adult (2 of 2 - PPSV23) 03/31/2015  . INFLUENZA VACCINE  08/10/2017    Patient Care Team: Copland, Gay Filler, MD as PCP - General (Family Medicine)  Indicate any recent Medical Services you may have received from other than [facility] providers in the past year (date may be approximate).     Assessment:   This is a routine wellness examination for Misty Rivas. Physical assessment deferred to PCP.  Hearing/Vision screen  Visual Acuity Screening   Right eye Left eye Both eyes  Without correction: 20/32 20/50 20/32   With correction:     Hearing Screening Comments: Able to hear conversational tones w/o difficulty. No issues reported.    Dietary issues and exercise activities discussed: Current Exercise Habits: The patient does not participate in regular exercise at present, Exercise limited by: None identified Diet (meal preparation, eat out, water intake, caffeinated beverages, dairy products, fruits and vegetables): in general, a "healthy" diet  , well balanced, on average, 3  meals per day Breakfast: hot tea, cereal with fruit Lunch: salad Dinner: soup and crackers  Goals    . Patient Stated     Figure out what her next step is in caring for her husband.      Depression Screen PHQ 2/9 Scores 11/16/2017 11/23/2016  PHQ - 2 Score 1 0    Fall Risk Fall Risk  11/16/2017 11/23/2016  Falls in the past year? 1 No     Cognitive Function: Ad8 score reviewed for issues:  Issues making decisions:no  Less interest in hobbies / activities:no  Repeats questions, stories (family complaining):no  Trouble using ordinary gadgets (microwave, computer, phone):no  Forgets the month or year: no  Mismanaging finances: no  Remembering appts:no  Daily problems with thinking and/or memory:no Ad8 score is=0         Screening Tests Health Maintenance  Topic Date Due  . MAMMOGRAM  12/08/1996  . PNA vac Low Risk Adult (2 of 2 - PPSV23) 03/31/2015  . INFLUENZA VACCINE  08/10/2017  . TETANUS/TDAP  12/22/2020  . COLONOSCOPY  11/06/2023  . DEXA SCAN  Completed  . Hepatitis C Screening  Completed     Plan:    Please schedule your next medicare wellness visit with me in 1 yr.  Continue to eat heart healthy diet (full of fruits, vegetables, whole grains, lean protein, water--limit salt, fat, and sugar intake) and increase physical activity as tolerated.  Bring a copy of your living will and/or healthcare power of attorney to your next office visit.  Consider a support group for Alzheimer caregivers     I have personally reviewed and noted the following in the patient's chart:   . Medical and social history . Use of alcohol, tobacco or illicit drugs  . Current medications and supplements . Functional ability and status . Nutritional status . Physical activity . Advanced directives . List of other physicians . Hospitalizations, surgeries, and ER visits in previous 12 months . Vitals . Screenings to include cognitive, depression, and  falls . Referrals and appointments  In addition, I have reviewed and discussed with patient certain preventive protocols, quality metrics, and best practice recommendations. A written personalized care plan for preventive services as well as general preventive health recommendations were provided to patient.     Shela Nevin, [place]   11/16/2017

## 2017-11-16 ENCOUNTER — Encounter: Payer: Self-pay | Admitting: *Deleted

## 2017-11-16 ENCOUNTER — Ambulatory Visit (INDEPENDENT_AMBULATORY_CARE_PROVIDER_SITE_OTHER): Payer: Medicare Other | Admitting: *Deleted

## 2017-11-16 VITALS — BP 136/84 | HR 58 | Ht 64.0 in | Wt 185.6 lb

## 2017-11-16 DIAGNOSIS — R945 Abnormal results of liver function studies: Secondary | ICD-10-CM | POA: Diagnosis not present

## 2017-11-16 DIAGNOSIS — Z0000 Encounter for general adult medical examination without abnormal findings: Secondary | ICD-10-CM | POA: Diagnosis not present

## 2017-11-16 DIAGNOSIS — R7989 Other specified abnormal findings of blood chemistry: Secondary | ICD-10-CM

## 2017-11-16 NOTE — Patient Instructions (Signed)
Please schedule your next medicare wellness visit with me in 1 yr.  Continue to eat heart healthy diet (full of fruits, vegetables, whole grains, lean protein, water--limit salt, fat, and sugar intake) and increase physical activity as tolerated.  Bring a copy of your living will and/or healthcare power of attorney to your next office visit.  Consider a support group for Alzheimer caregivers   Misty Rivas , Thank you for taking time to come for your Medicare Wellness Visit. I appreciate your ongoing commitment to your health goals. Please review the following plan we discussed and let me know if I can assist you in the future.   These are the goals we discussed: Goals    . Patient Stated     Figure out what her next step is in caring for her husband.       This is a list of the screening recommended for you and due dates:  Health Maintenance  Topic Date Due  . Mammogram  12/08/1996  . Pneumonia vaccines (2 of 2 - PPSV23) 03/31/2015  . Flu Shot  08/10/2017  . Tetanus Vaccine  12/22/2020  . Colon Cancer Screening  11/06/2023  . DEXA scan (bone density measurement)  Completed  .  Hepatitis C: One time screening is recommended by Center for Disease Control  (CDC) for  adults born from [birth year] through 1964.   Completed    Alzheimer Disease Caregiver Guide A person who has Alzheimer disease may not be able to take care of himself or herself. He or she may need help with simple tasks. The tips below can help you care for the person. Memory loss and confusion If the person is confused or cannot remember things:  Stay calm.  Respond with a short answer.  Avoid correcting him or her in a way that sounds like scolding.  Try not to take it personally, even if he or she forgets your name.  Behavior changes The person may go through behavior changes. This can include depression, anxiety, anger, or seeing things that are not there. When behavior changes:  Try not to take behavior  changes personally.  Stay calm and patient.  Do not argue or try to convince the person about a specific point.  Know that these changes are part of the disease process. Try to work through it.  Tips to lessen frustration  Make appointments and do daily tasks when the person is at his or her best.  Take your time. Simple tasks may take longer. Allow plenty of time to complete tasks.  Limit choices for the person.  Involve the person in what you are doing.  Stick to a routine.  Avoid new or crowded places, if possible.  Use simple words, short sentences, and a calm voice. Only give 1 direction at a time.  Buy clothes and shoes that are easy to put on and take off.  Let people help if they offer. Home safety  Keep floors clear. Remove rugs, magazine racks, and floor lamps.  Keep hallways well lit.  Put a handrail and nonslip mat in the bathtub or shower.  Put childproof locks on cabinets that have dangerous items in them. These items include medicine, alcohol, guns, toxic cleaning items, sharp tools, matches, or lighters.  Place locks on doors where the person cannot see or reach them. This helps the person to not wander out of the house and get lost.  Be prepared for emergencies. Keep a list of emergency phone numbers and  addresses in a handy area. Plans for the future  Talk about finances. ? Talk about money management. People with Alzheimer disease have trouble managing their money as the disease gets worse. ? Get help from professional advisors about financial and legal matters.  Talk about future care. ? Choose a power of attorney. This is someone who can make decisions for the person with Alzheimer disease when he or she can no longer do so. ? Talk about driving and when it is the right time to stop. The person's doctor can help with this. ? If the person lives alone, make sure he or she is safe. Some people need extra help at home. Other people need more care at  a nursing home or care center. Support groups Some benefits of joining a support group include:  Learning ways to manage stress.  Sharing experiences with others.  Getting emotional comfort and support.  Learning new caregiving skills as the disease progresses.  Knowing what community resources are available and taking advantage of them.  Get help if:  The person has a fever.  The person has a sudden behavior change that does not get better with calming strategies.  The person is unable to manage his or her living situation.  The person threatens you or anyone else, including himself or herself.  You are no longer able to care for the person. This information is not intended to replace advice given to you by your health care provider. Make sure you discuss any questions you have with your health care provider. Document Released: 03/21/2011 Document Revised: 06/04/2015 Document Reviewed: 02/16/2011 Elsevier Interactive Patient Education  2017 [employer].

## 2017-11-16 NOTE — Progress Notes (Signed)
I have reviewed above MWE note by Ms. Britt and agree with her documentation

## 2017-11-16 NOTE — Addendum Note (Signed)
Addended by: Caffie Pinto on: 11/16/2017 03:05 PM   Modules accepted: Orders

## 2017-11-17 LAB — HEPATIC FUNCTION PANEL
ALBUMIN: 4.5 g/dL (ref 3.5–5.2)
ALK PHOS: 78 U/L (ref 39–117)
ALT: 146 U/L — ABNORMAL HIGH (ref 0–35)
AST: 144 U/L — ABNORMAL HIGH (ref 0–37)
Bilirubin, Direct: 0.2 mg/dL (ref 0.0–0.3)
Total Bilirubin: 0.7 mg/dL (ref 0.2–1.2)
Total Protein: 7.1 g/dL (ref 6.0–8.3)

## 2017-11-18 ENCOUNTER — Encounter: Payer: Self-pay | Admitting: Family Medicine

## 2017-11-20 ENCOUNTER — Encounter: Payer: Self-pay | Admitting: Family Medicine

## 2017-11-21 NOTE — Progress Notes (Signed)
[facility] [place], [place], [place] [address] [phone]  Date:  11/23/2017   Name:  Misty Rivas   DOB:  [birth year]   MRN:  [id number]  PCP:  Darreld Mclean, MD    Chief Complaint: Weight Loss and Elevated Hepatic Enzymes   History of Present Illness:  Misty Rivas is a 72 y.o. very pleasant female patient who presents with the following:  Would like to discuss weight loss today History of HTN Also her LFTs have increased again  -US showed fatty liver in February of this year  She did see GI about a year ago, did not have a follow-up appt  From that visit:  ASSESSMENT AND PLAN: 1. Pleasant 72 yo female with transaminitis, apparently first recognized a year ago through [place].  Transaminases initially only mildly elevated, now ALT consistently above 100. Most recent labs : AST / ALT in 150's-160 range. U/S last year suggested fatty liver.  -Transaminases have been abnormal on several occasions over the last year and higher than typically seen with steatosis alone. Will obtain all the markers of chronic liver disease such as ANA, ASMA, ferritin, viral hep studies, etc.. Additionally will check tTg, IgA.  -repeat LFT, INR as patient is for knee surgery in a few weeks and if progressive transaminitis she may to postpone surgery until we can sort things out.  2. Colon cancer screening. Apparently had last colonoscopy 5 years ago, says she is due for another. Will request records.   She went through menopause in her early/ mid 19s   Wt Readings from Last 3 Encounters:  11/23/17 187 lb (84.8 kg)  11/16/17 185 lb 9.6 oz (84.2 kg)  05/29/17 185 lb (83.9 kg)   Last seen here in May with possible shingles  Mammo:  February per outside facility  Flu shot:  She declines this today  ? Do a dose of pneumovax for her -will give today  She plans to get shingrix done at the drug store  Her husband has dementia and  is fairly advanced. He has been sick for the past 2.5 years and is getting worse. He can be left alone for short periods of time only  He is on the elixon patch  Anah notes that she is quite tired She sleeps through the night - she feels ok through the morning but gets tired towards dinner time She has to take a nap around 5pm most days- 30 minutes and this helps her feel better She does snore some, but after discussion she does not wish to have a sleep study at this time   She notes that she "aches all the time"  Her joints and her back are painful  She uses tylenol for joint pains- she is not taking much of this however Baby asa daily She does not drink alcohol   She is not aware of any family history of hemochromatosis or other iron concerns   Patient Active Problem List   Diagnosis Date Noted  . Essential hypertension 11/23/2016  . Estrogen deficiency 11/23/2016    Past Medical History:  Diagnosis Date  . Acid reflux   . Arthritis   . Degenerative joint disease of knee   . Fibromyalgia   . Hiatal hernia     Past Surgical History:  Procedure Laterality Date  . CESAREAN SECTION    . CHOLECYSTECTOMY    . TONSILLECTOMY  1955  . TOTAL  KNEE ARTHROPLASTY Left 06/19/2017    Social History   Tobacco Use  . Smoking status: Never Smoker  . Smokeless tobacco: Never Used  Substance Use Topics  . Alcohol use: Yes    Frequency: Never    Comment: occasional once monthly  . Drug use: No    Family History  Problem Relation Age of Onset  . Mental illness Mother   . Heart disease Father   . Prostate cancer Father   . Breast cancer Sister   . Colon cancer Neg Hx   . Stomach cancer Neg Hx   . Rectal cancer Neg Hx   . Esophageal cancer Neg Hx   . Liver cancer Neg Hx     Allergies  Allergen Reactions  . Lisinopril Other (See Comments)    Per pt cannot tolerate medication makes her fatigued  . Sulfa Antibiotics Other (See Comments) and Rash    Other reaction(s): Other  (See Comments) Unknown reaction per pt, hasnt taken in a long time. Unknown reaction per pt, hasnt taken in a long time.     Medication list has been reviewed and updated.  Current Outpatient Medications on File Prior to Visit  Medication Sig Dispense Refill  . amLODipine (NORVASC) 5 MG tablet Take 1 tablet (5 mg total) daily by mouth. 90 tablet 3  . Calcium Carb-Cholecalciferol (CALCIUM-VITAMIN D) 500-200 MG-UNIT tablet Take by mouth.    . escitalopram (LEXAPRO) 20 MG tablet TAKE 1 TABLET (20 MG TOTAL) BY MOUTH DAILY. 90 tablet 1  . metoprolol tartrate (LOPRESSOR) 25 MG tablet Take 1 tablet (25 mg total) by mouth 2 (two) times daily. 180 tablet 3  . Multiple Vitamin (MULTI-VITAMINS) TABS Take by mouth.    . naproxen sodium (ALEVE) 220 MG tablet Take 220 mg by mouth as needed.      No current facility-administered medications on file prior to visit.     Review of Systems:  As per HPI- otherwise negative.   Physical Examination: Vitals:   11/23/17 1403  BP: 130/86  Pulse: 61  Resp: 16  Temp: 98.1 F (36.7 C)  SpO2: 97%   Vitals:   11/23/17 1403  Weight: 187 lb (84.8 kg)  Height: 5' 4"  (1.626 m)   Body mass index is 32.1 kg/m. Ideal Body Weight: Weight in (lb) to have BMI = 25: 145.3  GEN: WDWN, NAD, Non-toxic, A & O x 3, overweight, looks well  HEENT: Atraumatic, Normocephalic. Neck supple. No masses, No LAD. Bilateral TM wnl, oropharynx normal.  PEERL,EOMI.   Ears and Nose: No external deformity. CV: RRR, No M/G/R. No JVD. No thrill. No extra heart sounds. PULM: CTA B, no wheezes, crackles, rhonchi. No retractions. No resp. distress. No accessory muscle use. ABD: S, NT, ND, +BS. No rebound. No HSM. EXTR: No c/c/e NEURO Normal gait.  PSYCH: Normally interactive. Conversant. Not depressed or anxious appearing.  Calm demeanor.    Assessment and Plan: High serum ferritin - Plan: Ferritin, Hemochromatosis DNA-PCR(c282y,h63d), CBC, Transferrin  Saturation  Immunization due - Plan: Pneumococcal polysaccharide vaccine 23-valent greater than or equal to 2yo subcutaneous/IM  Other fatigue - Plan: TSH  Transaminitis  Stress due to illness of family member  Following up today Possible hemochromatosis Check labs as above Tsh for fatigue Pneumonia vaccine today Will plan further follow- up pending labs. Offered support and encouragement regarding her dad's illness   Signed Lamar Blinks, MD

## 2017-11-23 ENCOUNTER — Encounter: Payer: Self-pay | Admitting: Family Medicine

## 2017-11-23 ENCOUNTER — Ambulatory Visit (INDEPENDENT_AMBULATORY_CARE_PROVIDER_SITE_OTHER): Payer: Medicare Other | Admitting: Family Medicine

## 2017-11-23 VITALS — BP 130/86 | HR 61 | Temp 98.1°F | Resp 16 | Ht 64.0 in | Wt 187.0 lb

## 2017-11-23 DIAGNOSIS — R740 Nonspecific elevation of levels of transaminase and lactic acid dehydrogenase [LDH]: Secondary | ICD-10-CM | POA: Diagnosis not present

## 2017-11-23 DIAGNOSIS — Z23 Encounter for immunization: Secondary | ICD-10-CM

## 2017-11-23 DIAGNOSIS — R7401 Elevation of levels of liver transaminase levels: Secondary | ICD-10-CM

## 2017-11-23 DIAGNOSIS — R5383 Other fatigue: Secondary | ICD-10-CM

## 2017-11-23 DIAGNOSIS — Z6379 Other stressful life events affecting family and household: Secondary | ICD-10-CM | POA: Diagnosis not present

## 2017-11-23 DIAGNOSIS — R7989 Other specified abnormal findings of blood chemistry: Secondary | ICD-10-CM

## 2017-11-23 NOTE — Patient Instructions (Signed)
Good to see you today!   You got your last pneumonia vaccine today I will be in touch with your labs asap Please get the shingles vaccine at the drug store at your convenience We are going to look into iron overload as possible cause of your liver tests being high  If you need to take a nap in the afternoon I think that is quite reasonable  I would also encourage you to work on getting some respite care for your husband- you need to take care of the caregiver (you!) as well

## 2017-11-24 LAB — CBC
HEMATOCRIT: 42.9 % (ref 36.0–46.0)
Hemoglobin: 14.3 g/dL (ref 12.0–15.0)
MCHC: 33.3 g/dL (ref 30.0–36.0)
MCV: 88 fl (ref 78.0–100.0)
PLATELETS: 290 10*3/uL (ref 150.0–400.0)
RBC: 4.88 Mil/uL (ref 3.87–5.11)
RDW: 13 % (ref 11.5–15.5)
WBC: 6.9 10*3/uL (ref 4.0–10.5)

## 2017-11-24 LAB — TSH: TSH: 1.63 u[IU]/mL (ref 0.35–4.50)

## 2017-11-24 LAB — FERRITIN: Ferritin: 111.6 ng/mL (ref 10.0–291.0)

## 2017-11-25 LAB — TRANSFERRIN SATURATION
IRON SATN MFR SERPL: 23 %{saturation}
IRON SERPL-MCNC: 95 ug/dL
TRANSFERRIN SERPL-MCNC: 298 mg/dL

## 2017-11-28 ENCOUNTER — Encounter: Payer: Self-pay | Admitting: Family Medicine

## 2017-11-28 LAB — HEMOCHROMATOSIS DNA-PCR(C282Y,H63D)

## 2017-12-01 ENCOUNTER — Encounter: Payer: Self-pay | Admitting: Family Medicine

## 2017-12-26 ENCOUNTER — Other Ambulatory Visit: Payer: Self-pay | Admitting: Family Medicine

## 2017-12-26 DIAGNOSIS — I10 Essential (primary) hypertension: Secondary | ICD-10-CM

## 2018-01-24 ENCOUNTER — Ambulatory Visit: Payer: Medicare Other | Admitting: Gastroenterology

## 2018-01-31 ENCOUNTER — Other Ambulatory Visit: Payer: Self-pay | Admitting: Family Medicine

## 2018-02-05 ENCOUNTER — Ambulatory Visit (INDEPENDENT_AMBULATORY_CARE_PROVIDER_SITE_OTHER): Payer: Medicare Other | Admitting: Family Medicine

## 2018-02-05 ENCOUNTER — Encounter: Payer: Self-pay | Admitting: Family Medicine

## 2018-02-05 VITALS — BP 145/90 | HR 57 | Temp 97.9°F

## 2018-02-05 DIAGNOSIS — J0110 Acute frontal sinusitis, unspecified: Secondary | ICD-10-CM | POA: Diagnosis not present

## 2018-02-05 MED ORDER — AMOXICILLIN-POT CLAVULANATE 875-125 MG PO TABS: 1.0000 | ORAL_TABLET | Freq: Two times a day (BID) | ORAL | 0 refills | Status: DC

## 2018-02-05 NOTE — Patient Instructions (Signed)
It was good to see you today, but I am sorry you are not feeling well We are going to treat you for presumed sinus infection with Augmentin, take this twice a day for 10 days. However, as we discussed if you are getting worse or if symptoms become more pronounced around your eye please call me right away or otherwise seek care

## 2018-02-05 NOTE — Progress Notes (Signed)
[facility] [address] [place], [place] [address] [phone]  Date:  02/05/2018   Name:  Misty Rivas   DOB:  [birth year]   MRN:  [id number]  PCP:  Darreld Mclean, MD    Chief Complaint: No chief complaint on file.   History of Present Illness:  Misty Rivas is a 73 y.o. very pleasant female patient who presents with the following:  History of HTN, ow generally in good health  Here today with illness for 8- 10 days Started with a ST Sx are waxing and waning She has a cough Her ears feel "like pressure" She did have a fever the first few days   Her cough can be productive in the am No vomiting or diarrhea No sick contacts  Her nose is runny She feels pressure in her face and around her eyes- right more than left  Vision is ok   Patient Active Problem List   Diagnosis Date Noted  . Essential hypertension 11/23/2016  . Estrogen deficiency 11/23/2016    Past Medical History:  Diagnosis Date  . Acid reflux   . Arthritis   . Degenerative joint disease of knee   . Fibromyalgia   . Hiatal hernia     Past Surgical History:  Procedure Laterality Date  . CESAREAN SECTION    . CHOLECYSTECTOMY    . TONSILLECTOMY  1955  . 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 Smoking status: Never Smoker  . Smokeless tobacco: Never Used  Substance Use Topics  . Alcohol use: Yes    Frequency: Never    Comment: occasional once monthly  . Drug use: No    Family History  Problem Relation Age of Onset  . Mental illness Mother   . Heart disease Father   . Prostate cancer Father   . Breast cancer Sister   . Colon cancer Neg Hx   . Stomach cancer Neg Hx   . Rectal cancer Neg Hx   . Esophageal cancer Neg Hx   . Liver cancer Neg Hx     Allergies  Allergen Reactions  . Lisinopril Other (See Comments)    Per pt cannot tolerate medication makes her fatigued  . Sulfa  Antibiotics Other (See Comments) and Rash    Other reaction(s): Other (See Comments) Unknown reaction per pt, hasnt taken in a long time. Unknown reaction per pt, hasnt taken in a long time.     Medication list has been reviewed and updated.  Current Outpatient Medications on File Prior to Visit  Medication Sig Dispense Refill  . amLODipine (NORVASC) 5 MG tablet Take 1 tablet (5 mg total) daily by mouth. 90 tablet 3  . Calcium Carb-Cholecalciferol (CALCIUM-VITAMIN D) 500-200 MG-UNIT tablet Take by mouth.    . escitalopram (LEXAPRO) 20 MG tablet TAKE 1 TABLET (20 MG TOTAL) BY MOUTH DAILY. 90 tablet 1  . metoprolol tartrate (LOPRESSOR) 25 MG tablet TAKE 1 TABLET BY MOUTH TWICE A DAY 180 tablet 3  . Multiple Vitamin (MULTI-VITAMINS) TABS Take by mouth.    . naproxen sodium (ALEVE) 220 MG tablet Take 220 mg by mouth as needed.      No current facility-administered medications on file prior to visit.     Review of Systems:  As per HPI- otherwise negative.   Physical Examination: Vitals:   02/05/18 1514  BP: (!) 145/90  Pulse: (!) 57  Temp: 97.9 F (36.6 C)  SpO2: 97%   There were no vitals filed for this visit. There is no height or weight on file to calculate BMI. Ideal Body Weight:    GEN: WDWN, NAD, Non-toxic, A & O x 3, looks well, overweight  HEENT: Atraumatic, Normocephalic. Neck supple. No masses, No LAD.  Bilateral TM wnl, oropharynx normal.  PEERL,EOMI.   Nasal cavity is inflamed, frontal sinus tenderness to percussion  Globes are non tender to gentle pressure  No redness around her eyes, no pain with movement of eyes  Ears and Nose: No external deformity. CV: RRR, No M/G/R. No JVD. No thrill. No extra heart sounds. PULM: CTA B, no wheezes, crackles, rhonchi. No retractions. No resp. distress. No accessory muscle use. EXTR: No c/c/e NEURO Normal gait.  PSYCH: Normally interactive. Conversant. Not depressed or anxious appearing.  Calm demeanor.    Assessment and  Plan: Acute non-recurrent frontal sinusitis - Plan: amoxicillin-clavulanate (AUGMENTIN) 875-125 MG tablet  Here today with a sinus infection- she also notes that her eyes ache At this time I do not see evidence of preseptal cellulitis but cautioned about about signs and sx of this disease augmentin rx If she is not getting better or if getting any worse, more pronounced eye sx she will seek care right away   Signed Lamar Blinks, MD

## 2018-02-12 ENCOUNTER — Other Ambulatory Visit: Payer: Self-pay | Admitting: Family Medicine

## 2018-02-12 DIAGNOSIS — I10 Essential (primary) hypertension: Secondary | ICD-10-CM

## 2018-03-02 ENCOUNTER — Ambulatory Visit (INDEPENDENT_AMBULATORY_CARE_PROVIDER_SITE_OTHER): Payer: Medicare Other | Admitting: Gastroenterology

## 2018-03-02 ENCOUNTER — Other Ambulatory Visit: Payer: Medicare Other

## 2018-03-02 ENCOUNTER — Encounter

## 2018-03-02 ENCOUNTER — Encounter: Payer: Self-pay | Admitting: Gastroenterology

## 2018-03-02 VITALS — BP 140/94 | HR 76 | Ht 64.0 in | Wt 183.0 lb

## 2018-03-02 DIAGNOSIS — K219 Gastro-esophageal reflux disease without esophagitis: Secondary | ICD-10-CM | POA: Diagnosis not present

## 2018-03-02 DIAGNOSIS — R945 Abnormal results of liver function studies: Secondary | ICD-10-CM

## 2018-03-02 DIAGNOSIS — Z86010 Personal history of colonic polyps: Secondary | ICD-10-CM

## 2018-03-02 DIAGNOSIS — R7989 Other specified abnormal findings of blood chemistry: Secondary | ICD-10-CM

## 2018-03-02 DIAGNOSIS — K5909 Other constipation: Secondary | ICD-10-CM | POA: Diagnosis not present

## 2018-03-02 DIAGNOSIS — K7581 Nonalcoholic steatohepatitis (NASH): Secondary | ICD-10-CM

## 2018-03-02 LAB — HEPATIC FUNCTION PANEL
ALBUMIN: 4.6 g/dL (ref 3.5–5.2)
ALT: 80 U/L — ABNORMAL HIGH (ref 0–35)
AST: 82 U/L — ABNORMAL HIGH (ref 0–37)
Alkaline Phosphatase: 77 U/L (ref 39–117)
Bilirubin, Direct: 0.1 mg/dL (ref 0.0–0.3)
TOTAL PROTEIN: 7.5 g/dL (ref 6.0–8.3)
Total Bilirubin: 0.7 mg/dL (ref 0.2–1.2)

## 2018-03-02 MED ORDER — LINACLOTIDE 145 MCG PO CAPS: 145.0000 ug | ORAL_CAPSULE | Freq: Every day | ORAL | 3 refills | Status: DC

## 2018-03-02 MED ORDER — NA SULFATE-K SULFATE-MG SULF 17.5-3.13-1.6 GM/177ML PO SOLN: 1.0000 | Freq: Once | ORAL | 0 refills | Status: AC

## 2018-03-02 NOTE — Progress Notes (Signed)
Misty Rivas    [id number]    [birth year]  Primary Care Physician:Copland, Gay Filler, MD  Referring Physician: Darreld Mclean, [address] [place], [place] [address]  Chief complaint: Abnormal LFT  HPI: 73 year old female with history of elevated transaminases here for follow-up visit.  AST and ALT remain elevated, slightly higher in November 2019. Otherwise denies any complaints. Denies any nausea, vomiting, abdominal pain, melena or bright red blood per rectum  Review of system positive for constipation with bowel movement once every 2 to 3 days, takes senna as needed.  Constipation worse after knee surgery.  Denies any blood in stool or per rectum.  Is taking naproxen occasionally, denies regular use.  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 and is currently under significant stress  Relevant GI history: ANA positive 1:160 homogeneous consistent with lupus, normal CRP and ESR IgG level normal Ferritin 366 and normal iron saturation Viral hepatitis studies negative Denies excessive EtOH use  Hepatic Function Latest Ref Rng & Units 03/02/2018 11/16/2017 05/29/2017  Total Protein 6.0 - 8.3 g/dL 7.5 7.1 6.7  Albumin 3.5 - 5.2 g/dL 4.6 4.5 4.2  AST 0 - 37 U/L 82(H) 144(H) 91(H)  ALT 0 - 35 U/L 80(H) 146(H) 107(H)  Alk Phosphatase 39 - 117 U/L 77 78 66  Total Bilirubin 0.2 - 1.2 mg/dL 0.7 0.7 0.6  Bilirubin, Direct 0.0 - 0.3 mg/dL 0.1 0.2 0.1    Outpatient Encounter Medications as of 03/02/2018  Medication Sig  . amLODipine (NORVASC) 5 MG tablet TAKE 1 TABLET (5 MG TOTAL) DAILY BY MOUTH.  . Calcium Carb-Cholecalciferol (CALCIUM-VITAMIN D) 500-200 MG-UNIT tablet Take by mouth.  . escitalopram (LEXAPRO) 20 MG tablet TAKE 1 TABLET (20 MG TOTAL) BY MOUTH DAILY.  . metoprolol tartrate (LOPRESSOR) 25 MG tablet TAKE 1 TABLET BY MOUTH TWICE A DAY  . Multiple Vitamin (MULTI-VITAMINS) TABS Take by mouth.  . naproxen sodium  (ALEVE) 220 MG tablet Take 220 mg by mouth as needed.   . [DISCONTINUED] amoxicillin-clavulanate (AUGMENTIN) 875-125 MG tablet Take 1 tablet by mouth 2 (two) times daily.   No facility-administered encounter medications on file as of 03/02/2018.     Allergies as of 03/02/2018 - Review Complete 03/02/2018  Allergen Reaction Noted  . Lisinopril Other (See Comments) 05/20/2016  . Sulfa antibiotics Other (See Comments) and Rash 10/16/2012    Past Medical History:  Diagnosis Date  . Acid reflux   . Arthritis   . Degenerative joint disease of knee   . Fibromyalgia   . Hiatal hernia     Past Surgical History:  Procedure Laterality Date  . CESAREAN SECTION    . CHOLECYSTECTOMY    . TONSILLECTOMY  1955  . 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 Mental illness Mother   . Heart disease Father   . Prostate cancer Father   . Breast cancer Sister   . Colon cancer Neg Hx   . Stomach cancer Neg Hx   . Rectal cancer Neg Hx   . Esophageal cancer Neg Hx   . Liver cancer Neg Hx     Social History   Socioeconomic History  . Marital status: Married    Spouse name: Not on file  . Number of children: 2  . Years of education: Not on file  . Highest education level: Not on  file  Occupational History  . Occupation: retired  Scientific laboratory technician  . Financial resource strain: Not on file  . Food insecurity:    Worry: Not on file    Inability: Not on file  . Transportation needs:    Medical: Not on file    Non-medical: Not on file  Tobacco Use  . Smoking status: Never Smoker  . Smokeless tobacco: Never Used  Substance and Sexual Activity  . Alcohol use: Yes    Frequency: Never    Comment: occasional once monthly  . Drug use: No  . Sexual activity: Not Currently  Lifestyle  . Physical activity:    Days per week: Not on file    Minutes per session: Not on file  . Stress: Not on file  Relationships  . Social connections:    Talks  on phone: Not on file    Gets together: Not on file    Attends religious service: Not on file    Active member of club or organization: Not on file    Attends meetings of clubs or organizations: Not on file    Relationship status: Not on file  . Intimate partner violence:    Fear of current or ex partner: Not on file    Emotionally abused: Not on file    Physically abused: Not on file    Forced sexual activity: Not on file  Other Topics Concern  . Not on file  Social History Narrative  . Not on file      Review of systems: Review of Systems  Constitutional: Negative for fever and chills.  HENT: Negative.   Eyes: Negative for blurred vision.  Respiratory: Negative for cough, shortness of breath and wheezing.   Cardiovascular: Negative for chest pain and palpitations.  Gastrointestinal: as per HPI Genitourinary: Negative for dysuria, urgency, frequency and hematuria.  Musculoskeletal: Positive for myalgias, back pain and joint pain.  Skin: Negative for itching and rash.  Neurological: Negative for dizziness, tremors, focal weakness, seizures and loss of consciousness.  Endo/Heme/Allergies: Positive for seasonal allergies.  Psychiatric/Behavioral: Negative for depression, suicidal ideas and hallucinations.  All other systems reviewed and are negative.   Physical Exam: Vitals:   03/02/18 1053  BP: (!) 140/94  Pulse: 76   Body mass index is 31.41 kg/m. Gen:      No acute distress HEENT:  EOMI, sclera anicteric Neck:     No masses; no thyromegaly Lungs:    Clear to auscultation bilaterally; normal respiratory effort CV:         Regular rate and rhythm; no murmurs Abd:      + bowel sounds; soft, non-tender; no palpable masses, no distension Ext:    No edema; adequate peripheral perfusion Skin:      Warm and dry; no rash Neuro: alert and oriented x 3 Psych: normal mood and affect  Data Reviewed:  Reviewed labs, radiology imaging, old records and pertinent past GI work  up   Assessment and Plan/Recommendations:  73 year old female with history of elevated transaminases 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 Discussed in detail diet and exercise Advised patient to follow low-carb and low-fat diet Avoid alcohol Avoid NSAIDs and over-the-counter herbal remedies which can potentially be hepatotoxic Monitor LFT every 6 months  Constipation: Increase dietary fiber and fluid intake Start Linzess 145 mcg daily   GERD: Currently not  on PPI or H2 blocker.  Reluctant to take either medication Continue antireflux measures and lifestyle modification Continues to have persistent reflux symptoms.  History of erosive esophagitis and fundic gland polyps on EGD previously  Due for surveillance colonoscopy: history of polyps. Previous exams were done by [facility] and she was recommended recall colonoscopy in 5 years due to history of adenomatous colon polyps. Last colonoscopy October 2015 fair prep, diverticulosis and no polyps. We will proceed with repeat colonoscopy given the prep was inadequate, will need 2-day extended bowel prep  Complains of left upper quadrant abdominal discomfort, dyspepsia and GERD symptoms.  Schedule EGD along with colonoscopy  The risks and benefits as well as alternatives of endoscopic procedure(s) have been discussed and reviewed. All questions answered. The patient agrees to proceed.  Damaris Hippo , MD [phone]    CC: Copland, Gay Filler, MD

## 2018-03-02 NOTE — Patient Instructions (Signed)
You have been scheduled for an endoscopy and colonoscopy. Please follow the written instructions given to you at your visit today. Please pick up your prep supplies at the pharmacy within the next 1-3 days.   Start Linzess 125 mcg, we will send rx to your pharmacy  AVOID NSAIDS   Gastroesophageal Reflux Disease, Adult Gastroesophageal reflux (GER) happens when acid from the stomach flows up into the tube that connects the mouth and the stomach (esophagus). Normally, food travels down the esophagus and stays in the stomach to be digested. However, when a person has GER, food and stomach acid sometimes move back up into the esophagus. If this becomes a more serious problem, the person may be diagnosed with a disease called gastroesophageal reflux disease (GERD). GERD occurs when the reflux:  Happens often.  Causes frequent or severe symptoms.  Causes problems such as damage to the esophagus. When stomach acid comes in contact with the esophagus, the acid may cause soreness (inflammation) in the esophagus. Over time, GERD may create small holes (ulcers) in the lining of the esophagus. What are the causes? This condition is caused by a problem with the muscle between the esophagus and the stomach (lower esophageal sphincter, or LES). Normally, the LES muscle closes after food passes through the esophagus to the stomach. When the LES is weakened or abnormal, it does not close properly, and that allows food and stomach acid to go back up into the esophagus. The LES can be weakened by certain dietary substances, medicines, and medical conditions, including:  Tobacco use.  Pregnancy.  Having a hiatal hernia.  Alcohol use.  Certain foods and beverages, such as coffee, chocolate, onions, and peppermint. What increases the risk? You are more likely to develop this condition if you:  Have an increased body weight.  Have a connective tissue disorder.  Use NSAID medicines. What are the signs  or symptoms? Symptoms of this condition include:  Heartburn.  Difficult or painful swallowing.  The feeling of having a lump in the throat.  Abitter taste in the mouth.  Bad breath.  Having a large amount of saliva.  Having an upset or bloated stomach.  Belching.  Chest pain. Different conditions can cause chest pain. Make sure you see your health care provider if you experience chest pain.  Shortness of breath or wheezing.  Ongoing (chronic) cough or a night-time cough.  Wearing away of tooth enamel.  Weight loss. How is this diagnosed? Your health care provider will take a medical history and perform a physical exam. To determine if you have mild or severe GERD, your health care provider may also monitor how you respond to treatment. You may also have tests, including:  A test to examine your stomach and esophagus with a small camera (endoscopy).  A test thatmeasures the acidity level in your esophagus.  A test thatmeasures how much pressure is on your esophagus.  A barium swallow or modified barium swallow test to show the shape, size, and functioning of your esophagus. How is this treated? The goal of treatment is to help relieve your symptoms and to prevent complications. Treatment for this condition may vary depending on how severe your symptoms are. Your health care provider may recommend:  Changes to your diet.  Medicine.  Surgery. Follow these instructions at home: Eating and drinking   Follow a diet as recommended by your health care provider. This may involve avoiding foods and drinks such as: ? Coffee and tea (with or without caffeine). ?  Drinks that containalcohol. ? Energy drinks and sports drinks. ? Carbonated drinks or sodas. ? Chocolate and cocoa. ? Peppermint and mint flavorings. ? Garlic and onions. ? Horseradish. ? Spicy and acidic foods, including peppers, chili powder, curry powder, vinegar, hot sauces, and barbecue sauce. ? Citrus  fruit juices and citrus fruits, such as oranges, lemons, and limes. ? Tomato-based foods, such as red sauce, chili, salsa, and pizza with red sauce. ? Fried and fatty foods, such as donuts, french fries, potato chips, and high-fat dressings. ? High-fat meats, such as hot dogs and fatty cuts of red and white meats, such as rib eye steak, sausage, ham, and bacon. ? High-fat dairy items, such as whole milk, butter, and cream cheese.  Eat small, frequent meals instead of large meals.  Avoid drinking large amounts of liquid with your meals.  Avoid eating meals during the 2-3 hours before bedtime.  Avoid lying down right after you eat.  Do not exercise right after you eat. Lifestyle   Do not use any products that contain nicotine or tobacco, such as cigarettes, e-cigarettes, and chewing tobacco. If you need help quitting, ask your health care provider.  Try to reduce your stress by using methods such as yoga or meditation. If you need help reducing stress, ask your health care provider.  If you are overweight, reduce your weight to an amount that is healthy for you. Ask your health care provider for guidance about a safe weight loss goal. General instructions  Pay attention to any changes in your symptoms.  Take over-the-counter and prescription medicines only as told by your health care provider. Do not take aspirin, ibuprofen, or other NSAIDs unless your health care provider told you to do so.  Wear loose-fitting clothing. Do not wear anything tight around your waist that causes pressure on your abdomen.  Raise (elevate) the head of your bed about 6 inches (15 cm).  Avoid bending over if this makes your symptoms worse.  Keep all follow-up visits as told by your health care provider. This is important. Contact a health care provider if:  You have: ? New symptoms. ? Unexplained weight loss. ? Difficulty swallowing or it hurts to swallow. ? Wheezing or a persistent cough. ? A  hoarse voice.  Your symptoms do not improve with treatment. Get help right away if you:  Have pain in your arms, neck, jaw, teeth, or back.  Feel sweaty, dizzy, or light-headed.  Have chest pain or shortness of breath.  Vomit and your vomit looks like blood or coffee grounds.  Faint.  Have stool that is bloody or black.  Cannot swallow, drink, or eat. Summary  Gastroesophageal reflux happens when acid from the stomach flows up into the esophagus. GERD is a disease in which the reflux happens often, causes frequent or severe symptoms, or causes problems such as damage to the esophagus.  Treatment for this condition may vary depending on how severe your symptoms are. Your health care provider may recommend diet and lifestyle changes, medicine, or surgery.  Contact a health care provider if you have new or worsening symptoms.  Take over-the-counter and prescription medicines only as told by your health care provider. Do not take aspirin, ibuprofen, or other NSAIDs unless your health care provider told you to do so.  Keep all follow-up visits as told by your health care provider. This is important. This information is not intended to replace advice given to you by your health care provider. Make sure you discuss  any questions you have with your health care provider. Document Released: 10/06/2004 Document Revised: 07/05/2017 Document Reviewed: 07/05/2017 Elsevier Interactive Patient Education  2019 [employer].   Constipation, Adult Constipation is when a person:  Poops (has a bowel movement) fewer times in a week than normal.  Has a hard time pooping.  Has poop that is dry, hard, or bigger than normal. Follow these instructions at home: Eating and drinking   Eat foods that have a lot of fiber, such as: ? Fresh fruits and vegetables. ? Whole grains. ? Beans.  Eat less of foods that are high in fat, low in fiber, or overly processed, such as: ? [place]  fries. ? Hamburgers. ? Cookies. ? Candy. ? Soda.  Drink enough fluid to keep your pee (urine) clear or pale yellow. General instructions  Exercise regularly or as told by your doctor.  Go to the restroom when you feel like you need to poop. Do not hold it in.  Take over-the-counter and prescription medicines only as told by your doctor. These include any fiber supplements.  Do pelvic floor retraining exercises, such as: ? Doing deep breathing while relaxing your lower belly (abdomen). ? Relaxing your pelvic floor while pooping.  Watch your condition for any changes.  Keep all follow-up visits as told by your doctor. This is important. Contact a doctor if:  You have pain that gets worse.  You have a fever.  You have not pooped for 4 days.  You throw up (vomit).  You are not hungry.  You lose weight.  You are bleeding from the anus.  You have thin, pencil-like poop (stool). Get help right away if:  You have a fever, and your symptoms suddenly get worse.  You leak poop or have blood in your poop.  Your belly feels hard or bigger than normal (is bloated).  You have very bad belly pain.  You feel dizzy or you faint. This information is not intended to replace advice given to you by your health care provider. Make sure you discuss any questions you have with your health care provider. Document Released: 06/15/2007 Document Revised: 07/17/2015 Document Reviewed: 06/17/2015 Elsevier Interactive Patient Education  2019 [employer].

## 2018-03-07 DIAGNOSIS — Z1231 Encounter for screening mammogram for malignant neoplasm of breast: Secondary | ICD-10-CM | POA: Diagnosis not present

## 2018-03-07 LAB — HM MAMMOGRAPHY

## 2018-03-15 ENCOUNTER — Encounter: Payer: Self-pay | Admitting: Family Medicine

## 2018-04-06 ENCOUNTER — Encounter: Payer: Medicare Other | Admitting: Gastroenterology

## 2018-06-13 DIAGNOSIS — Z96652 Presence of left artificial knee joint: Secondary | ICD-10-CM | POA: Diagnosis not present

## 2018-06-13 DIAGNOSIS — Z471 Aftercare following joint replacement surgery: Secondary | ICD-10-CM | POA: Diagnosis not present

## 2018-06-13 DIAGNOSIS — M25462 Effusion, left knee: Secondary | ICD-10-CM | POA: Diagnosis not present

## 2018-06-15 DIAGNOSIS — H0100A Unspecified blepharitis right eye, upper and lower eyelids: Secondary | ICD-10-CM | POA: Diagnosis not present

## 2018-06-15 DIAGNOSIS — H5203 Hypermetropia, bilateral: Secondary | ICD-10-CM | POA: Diagnosis not present

## 2018-06-15 DIAGNOSIS — H04123 Dry eye syndrome of bilateral lacrimal glands: Secondary | ICD-10-CM | POA: Diagnosis not present

## 2018-06-15 DIAGNOSIS — H0100B Unspecified blepharitis left eye, upper and lower eyelids: Secondary | ICD-10-CM | POA: Diagnosis not present

## 2018-06-15 DIAGNOSIS — H524 Presbyopia: Secondary | ICD-10-CM | POA: Diagnosis not present

## 2018-06-15 DIAGNOSIS — H40003 Preglaucoma, unspecified, bilateral: Secondary | ICD-10-CM | POA: Diagnosis not present

## 2018-06-15 DIAGNOSIS — H43393 Other vitreous opacities, bilateral: Secondary | ICD-10-CM | POA: Diagnosis not present

## 2018-06-15 DIAGNOSIS — H52203 Unspecified astigmatism, bilateral: Secondary | ICD-10-CM | POA: Diagnosis not present

## 2018-06-15 DIAGNOSIS — Z961 Presence of intraocular lens: Secondary | ICD-10-CM | POA: Diagnosis not present

## 2018-06-19 DIAGNOSIS — M79674 Pain in right toe(s): Secondary | ICD-10-CM | POA: Diagnosis not present

## 2018-07-11 DIAGNOSIS — M10071 Idiopathic gout, right ankle and foot: Secondary | ICD-10-CM | POA: Diagnosis not present

## 2018-07-12 DIAGNOSIS — M10071 Idiopathic gout, right ankle and foot: Secondary | ICD-10-CM | POA: Diagnosis not present

## 2018-10-31 DIAGNOSIS — Z23 Encounter for immunization: Secondary | ICD-10-CM | POA: Diagnosis not present

## 2018-11-05 ENCOUNTER — Other Ambulatory Visit (INDEPENDENT_AMBULATORY_CARE_PROVIDER_SITE_OTHER): Payer: Medicare Other

## 2018-11-05 ENCOUNTER — Other Ambulatory Visit: Payer: Self-pay

## 2018-11-05 DIAGNOSIS — K760 Fatty (change of) liver, not elsewhere classified: Secondary | ICD-10-CM | POA: Diagnosis not present

## 2018-11-05 DIAGNOSIS — K7581 Nonalcoholic steatohepatitis (NASH): Secondary | ICD-10-CM

## 2018-11-05 DIAGNOSIS — R7989 Other specified abnormal findings of blood chemistry: Secondary | ICD-10-CM

## 2018-11-05 LAB — HEPATIC FUNCTION PANEL
ALT: 46 U/L — ABNORMAL HIGH (ref 0–35)
AST: 57 U/L — ABNORMAL HIGH (ref 0–37)
Albumin: 4.4 g/dL (ref 3.5–5.2)
Alkaline Phosphatase: 83 U/L (ref 39–117)
Bilirubin, Direct: 0.1 mg/dL (ref 0.0–0.3)
Total Bilirubin: 0.8 mg/dL (ref 0.2–1.2)
Total Protein: 7 g/dL (ref 6.0–8.3)

## 2018-11-05 NOTE — Progress Notes (Signed)
.  h 

## 2018-11-07 DIAGNOSIS — K045 Chronic apical periodontitis: Secondary | ICD-10-CM | POA: Diagnosis not present

## 2018-11-07 DIAGNOSIS — A429 Actinomycosis, unspecified: Secondary | ICD-10-CM | POA: Diagnosis not present

## 2018-11-09 ENCOUNTER — Telehealth: Payer: Self-pay | Admitting: Gastroenterology

## 2018-11-12 NOTE — Telephone Encounter (Signed)
See result note.  

## 2018-11-16 NOTE — Progress Notes (Deleted)
Virtual Visit via Video Note  I connected with patient on 11/19/18 at  1:00 PM EST by audio enabled telemedicine application and verified that I am speaking with the correct person using two identifiers.   THIS ENCOUNTER IS A VIRTUAL VISIT DUE TO COVID-19 - PATIENT WAS NOT SEEN IN THE OFFICE. PATIENT HAS CONSENTED TO VIRTUAL VISIT / TELEMEDICINE VISIT   Location of patient: home  Location of provider: office  I discussed the limitations of evaluation and management by telemedicine and the availability of in person appointments. The patient expressed understanding and agreed to proceed.   Subjective:   Misty Rivas is a 73 y.o. female who presents for Medicare Annual (Subsequent) preventive examination.  Review of Systems:  Home Safety/Smoke Alarms: Feels safe in home. Smoke alarms in place.  Lives w/ husband in 2 story home.   Female:      Mammo- 03/07/18       Dexa scan- 12/07/16       CCS- 11/05/13  Objective:     Vitals: There were no vitals taken for this visit.  There is no height or weight on file to calculate BMI.  Advanced Directives 11/16/2017  Does Patient Have a Medical Advance Directive? Yes  Type of Paramedic of [place];Living will  Copy of [place] in Chart? No - copy requested    Tobacco Social History   Tobacco Use  Smoking Status Never Smoker  Smokeless Tobacco Never Used     Counseling given: Not Answered   Clinical Intake:                       Past Medical History:  Diagnosis Date  . Acid reflux   . Arthritis   . Degenerative joint disease of knee   . Fibromyalgia   . Hiatal hernia    Past Surgical History:  Procedure Laterality Date  . CESAREAN SECTION    . CHOLECYSTECTOMY    . TONSILLECTOMY  1955  . 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 Mental illness Mother   . Heart disease Father   . Prostate cancer Father   .  Breast cancer Sister   . Colon cancer Neg Hx   . Stomach cancer Neg Hx   . Rectal cancer Neg Hx   . Esophageal cancer Neg Hx   . Liver cancer Neg Hx    Social History   Socioeconomic History  . Marital status: Married    Spouse name: Not on file  . Number of children: 2  . Years of education: Not on file  . Highest education level: Not on file  Occupational History  . Occupation: retired  Scientific laboratory technician  . Financial resource strain: Not on file  . Food insecurity    Worry: Not on file    Inability: Not on file  . Transportation needs    Medical: Not on file    Non-medical: Not on file  Tobacco Use  . Smoking status: Never Smoker  . Smokeless tobacco: Never Used  Substance and Sexual Activity  . Alcohol use: Yes    Frequency: Never    Comment: occasional once monthly  . Drug use: No  . Sexual activity: Not Currently  Lifestyle  . Physical activity    Days per week: Not on file    Minutes per session: Not on file  . Stress: Not on file  Relationships  .  Social Herbalist on phone: Not on file    Gets together: Not on file    Attends religious service: Not on file    Active member of club or organization: Not on file    Attends meetings of clubs or organizations: Not on file    Relationship status: Not on file  Other Topics Concern  . Not on file  Social History Narrative  . Not on file    Outpatient Encounter Medications as of 11/19/2018  Medication Sig  . amLODipine (NORVASC) 5 MG tablet TAKE 1 TABLET (5 MG TOTAL) DAILY BY MOUTH.  . Calcium Carb-Cholecalciferol (CALCIUM-VITAMIN D) 500-200 MG-UNIT tablet Take by mouth.  . escitalopram (LEXAPRO) 20 MG tablet TAKE 1 TABLET (20 MG TOTAL) BY MOUTH DAILY.  Marland Kitchen linaclotide (LINZESS) 145 MCG CAPS capsule Take 1 capsule (145 mcg total) by mouth daily before breakfast.  . metoprolol tartrate (LOPRESSOR) 25 MG tablet TAKE 1 TABLET BY MOUTH TWICE A DAY  . Multiple Vitamin (MULTI-VITAMINS) TABS Take by mouth.  .  naproxen sodium (ALEVE) 220 MG tablet Take 220 mg by mouth as needed.    No facility-administered encounter medications on file as of 11/19/2018.     Activities of Daily Living In your present state of health, do you have any difficulty performing the following activities: 11/16/2017  Hearing? N  Vision? N  Difficulty concentrating or making decisions? N  Walking or climbing stairs? N  Dressing or bathing? N  Doing errands, shopping? N  Preparing Food and eating ? N  Using the Toilet? N  In the past six months, have you accidently leaked urine? N  Do you have problems with loss of bowel control? N  Managing your Medications? N  Housekeeping or managing your Housekeeping? N  Some recent data might be hidden    Patient Care Team: Copland, Gay Filler, MD as PCP - General (Family Medicine)    Assessment:   This is a routine wellness examination for Misty Rivas. Physical assessment deferred to PCP.  Exercise Activities and Dietary recommendations   Diet (meal preparation, eat out, water intake, caffeinated beverages, dairy products, fruits and vegetables): {Desc; diets:16563} Breakfast: Lunch:  Dinner:      Goals    . Patient Stated     Figure out what her next step is in caring for her husband.       Fall Risk Fall Risk  11/16/2017 11/23/2016  Falls in the past year? 1 No    Depression Screen PHQ 2/9 Scores 11/16/2017 11/23/2016  PHQ - 2 Score 1 0     Cognitive Function Ad8 score reviewed for issues:  Issues making decisions:  Less interest in hobbies / activities:  Repeats questions, stories (family complaining):  Trouble using ordinary gadgets (microwave, computer, phone):  Forgets the month or year:   Mismanaging finances:   Remembering appts:  Daily problems with thinking and/or memory: Ad8 score is=         Immunization History  Administered Date(s) Administered  . Pneumococcal Conjugate-13 11/27/2013  . Pneumococcal Polysaccharide-23 03/31/2010,  11/23/2017  . Tdap 12/23/2010  . Zoster 04/20/2009    Screening Tests Health Maintenance  Topic Date Due  . INFLUENZA VACCINE  08/11/2018  . MAMMOGRAM  03/07/2020  . TETANUS/TDAP  12/22/2020  . COLONOSCOPY  11/06/2023  . DEXA SCAN  Completed  . Hepatitis C Screening  Completed  . PNA vac Low Risk Adult  Completed      Plan:   ***  I have personally reviewed and noted the following in the patient's chart:   . Medical and social history . Use of alcohol, tobacco or illicit drugs  . Current medications and supplements . Functional ability and status . Nutritional status . Physical activity . Advanced directives . List of other physicians . Hospitalizations, surgeries, and ER visits in previous 12 months . Vitals . Screenings to include cognitive, depression, and falls . Referrals and appointments  In addition, I have reviewed and discussed with patient certain preventive protocols, quality metrics, and best practice recommendations. A written personalized care plan for preventive services as well as general preventive health recommendations were provided to patient.     Shela Nevin, [place]  11/16/2018

## 2018-11-16 NOTE — Progress Notes (Signed)
[facility] [address], [place], [place] [address] [phone]  Date:  11/19/2018   Name:  Misty Rivas   DOB:  [birth year]   MRN:  [id number]  PCP:  Darreld Mclean, MD    Chief Complaint: No chief complaint on file.   History of Present Illness:  Misty Rivas is a 73 y.o. very pleasant female patient who presents with the following:  History of HTN, fibromyalgia Last seen by myself in January for a sinus infection Virtual visit today to discus a few questions that she had Pt location is home, provider is at office Pt ID confirmed with 2 factors, she gives consent for virtual visit today  She notes arthritis in her right hand and in her knees. She has seen some medications on TV which are used to prevent worsening of arthritis, and thought that perhaps she might need them- explained that these are likely for RA, not OA  Offered to bring her in for an exam and rheumatoid arthritis screening labs.  She feels reassured that she likely has OA, which is common in her family.  She will continue Tylenol as needed for symptoms  She has lost some weight and her LFTs are going down- good news She continues to follow-up with GI  She is traveling soon and will be around her sister who recently had shingles - she wonders if this might be contagious.  Advised her that shingles does not beget shingles to others, although it can cause chicken pox in a susceptible person.  She had Zostavax in 2011, plans to get Shingrix when she can  She notes that she sometimes get a headache above her right ear which she thinks is due to tension, she takes care of her husband who has Alzheimer's disease.  This can be quite stressful  These headaches are not severe or frequent, she does thought she should mention them  Patient Active Problem List   Diagnosis Date Noted  . Essential hypertension 11/23/2016  . Estrogen deficiency 11/23/2016     Past Medical History:  Diagnosis Date  . Acid reflux   . Arthritis   . Degenerative joint disease of knee   . Fibromyalgia   . Hiatal hernia     Past Surgical History:  Procedure Laterality Date  . CESAREAN SECTION    . CHOLECYSTECTOMY    . TONSILLECTOMY  1955  . 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 Smoking status: Never Smoker  . Smokeless tobacco: Never Used  Substance Use Topics  . Alcohol use: Yes    Frequency: Never    Comment: occasional once monthly  . Drug use: No    Family History  Problem Relation Age of Onset  . Mental illness Mother   . Heart disease Father   . Prostate cancer Father   . Breast cancer Sister   . Colon cancer Neg Hx   . Stomach cancer Neg Hx   . Rectal cancer Neg Hx   . Esophageal cancer Neg Hx   . Liver cancer Neg Hx     Allergies  Allergen Reactions  . Lisinopril Other (See Comments)    Per pt cannot tolerate medication makes her fatigued  . Sulfa Antibiotics Other (See Comments) and Rash    Other reaction(s): Other (See Comments) Unknown reaction per pt, hasnt taken in a long time. Unknown reaction per pt, hasnt  taken in a long time.     Medication list has been reviewed and updated.  Current Outpatient Medications on File Prior to Visit  Medication Sig Dispense Refill  . amLODipine (NORVASC) 5 MG tablet TAKE 1 TABLET (5 MG TOTAL) DAILY BY MOUTH. 90 tablet 1  . Calcium Carb-Cholecalciferol (CALCIUM-VITAMIN D) 500-200 MG-UNIT tablet Take by mouth.    . escitalopram (LEXAPRO) 20 MG tablet TAKE 1 TABLET (20 MG TOTAL) BY MOUTH DAILY. 90 tablet 1  . linaclotide (LINZESS) 145 MCG CAPS capsule Take 1 capsule (145 mcg total) by mouth daily before breakfast. 30 capsule 3  . metoprolol tartrate (LOPRESSOR) 25 MG tablet TAKE 1 TABLET BY MOUTH TWICE A DAY 180 tablet 3  . Multiple Vitamin (MULTI-VITAMINS) TABS Take by mouth.    . naproxen sodium (ALEVE) 220 MG tablet Take 220 mg by mouth as  needed.      No current facility-administered medications on file prior to visit.     Review of Systems:  As per HPI- otherwise negative. No chest pain, no fever or chills  Physical Examination: There were no vitals filed for this visit. There were no vitals filed for this visit. There is no height or weight on file to calculate BMI. Ideal Body Weight:    Spoke to pt on the phone- she sounds well  No cough, no wheezing, no shortness of breath She is not checking vitals at home Assessment and Plan: Immunization due  Primary osteoarthritis of right hand  Stress due to illness of family member  Encouraged shingles series.  She plans to have this done soon as possible Likely osteoarthritis of her hands.  No family history of rheumatoid arthritis, she has what sounds like ubiquitous osteoarthritis.  For the time being she will continue to manage at home Offered encouragement as she takes care of her husband.  She thinks this is causing occasional headaches, if they worsen or associated with vomiting, vision change, other concerning factors she will seek assistance  Spoke to patient for 9 minutes 30 seconds on the telephone  Signed Lamar Blinks, MD

## 2018-11-17 DIAGNOSIS — Z1159 Encounter for screening for other viral diseases: Secondary | ICD-10-CM | POA: Diagnosis not present

## 2018-11-19 ENCOUNTER — Encounter: Payer: Self-pay | Admitting: Family Medicine

## 2018-11-19 ENCOUNTER — Ambulatory Visit: Payer: Medicare Other | Admitting: *Deleted

## 2018-11-19 ENCOUNTER — Other Ambulatory Visit: Payer: Self-pay

## 2018-11-19 ENCOUNTER — Ambulatory Visit (INDEPENDENT_AMBULATORY_CARE_PROVIDER_SITE_OTHER): Payer: Medicare Other | Admitting: Family Medicine

## 2018-11-19 DIAGNOSIS — M19041 Primary osteoarthritis, right hand: Secondary | ICD-10-CM

## 2018-11-19 DIAGNOSIS — Z6379 Other stressful life events affecting family and household: Secondary | ICD-10-CM | POA: Diagnosis not present

## 2018-11-19 DIAGNOSIS — Z23 Encounter for immunization: Secondary | ICD-10-CM

## 2018-11-30 ENCOUNTER — Ambulatory Visit (INDEPENDENT_AMBULATORY_CARE_PROVIDER_SITE_OTHER): Payer: Medicare Other | Admitting: Gastroenterology

## 2018-11-30 ENCOUNTER — Other Ambulatory Visit: Payer: Self-pay

## 2018-11-30 ENCOUNTER — Encounter: Payer: Self-pay | Admitting: Gastroenterology

## 2018-11-30 VITALS — BP 112/70 | HR 57 | Temp 97.9°F | Ht 64.0 in | Wt 176.0 lb

## 2018-11-30 DIAGNOSIS — K7581 Nonalcoholic steatohepatitis (NASH): Secondary | ICD-10-CM

## 2018-11-30 DIAGNOSIS — R7989 Other specified abnormal findings of blood chemistry: Secondary | ICD-10-CM

## 2018-11-30 DIAGNOSIS — R945 Abnormal results of liver function studies: Secondary | ICD-10-CM | POA: Diagnosis not present

## 2018-11-30 DIAGNOSIS — K219 Gastro-esophageal reflux disease without esophagitis: Secondary | ICD-10-CM

## 2018-11-30 NOTE — Patient Instructions (Addendum)
Take Vitamin E capsule 400 IU daily  Take Vitamin D 5000 IU daily  Check your Vitamin D levels through Dr Adline Potter will need labs (Hepatic Panel) In 1 year  Follow up in 2 years  If you are age 72 or older, your body mass index should be between 23-30. Your Body mass index is 30.21 kg/m. If this is out of the aforementioned range listed, please consider follow up with your Primary Care Provider.  If you are age 27 or younger, your body mass index should be between 19-25. Your Body mass index is 30.21 kg/m. If this is out of the aformentioned range listed, please consider follow up with your Primary Care Provider.    I appreciate the  opportunity to care for you  Thank You   Harl Bowie , MD

## 2018-11-30 NOTE — Progress Notes (Signed)
Misty Rivas    [id number]    10/10/1945  Primary Care Physician:Copland, Gay Filler, MD  Referring Physician: Darreld Mclean, [address] [place],  [place] [address]   Chief complaint: Abnormal LFT, Misty Rivas HPI: 73 year old female with history of Misty Rivas, elevated transaminases here for follow-up visit. She is doing overall well.  Feels much better since she has intentionally lost weight. >  10 pounds in the past 6 months.  Denies any heartburn, dysphagia, odynophagia, change in bowel habits nausea, vomiting, abdominal pain, melena or bright red blood per rectum  No family history of GI malignancy.  Hepatic Function Latest Ref Rng & Units 11/05/2018 03/02/2018 11/16/2017  Total Protein 6.0 - 8.3 g/dL 7.0 7.5 7.1  Albumin 3.5 - 5.2 g/dL 4.4 4.6 4.5  AST 0 - 37 U/L 57(H) 82(H) 144(H)  ALT 0 - 35 U/L 46(H) 80(H) 146(H)  Alk Phosphatase 39 - 117 U/L 83 77 78  Total Bilirubin 0.2 - 1.2 mg/dL 0.8 0.7 0.7  Bilirubin, Direct 0.0 - 0.3 mg/dL 0.1 0.1 0.2    Relevant GI history: ANA positive 1:160 homogeneous consistent with lupus, normal CRP and ESR IgG level normal Ferritin 366 and normal iron saturation Viral hepatitis studies negative Denies excessive EtOH use  EGD November 05, 2013: Regular Z-line, hiatal hernia, benign fundic gland gastric polyps  Colonoscopy November 05, 2013 by Dr. Shana Chute at [facility]: Inadequate bowel prep improved to adequate after multiple lavage and suctioning, angulated sigmoid colon, tortuous, difficult procedure but was able to reach cecum. Left-sided diverticulosis and small internal hemorrhoids.  Recommended recall colonoscopy in 5 years with extended bowel prep.  Outpatient Encounter Medications as of 11/30/2018  Medication Sig  . amLODipine (NORVASC) 5 MG tablet TAKE 1 TABLET (5 MG TOTAL) DAILY BY MOUTH.  . Calcium Carb-Cholecalciferol (CALCIUM-VITAMIN D) 500-200 MG-UNIT tablet Take by mouth.  . escitalopram  (LEXAPRO) 20 MG tablet TAKE 1 TABLET (20 MG TOTAL) BY MOUTH DAILY.  . metoprolol tartrate (LOPRESSOR) 25 MG tablet TAKE 1 TABLET BY MOUTH TWICE A DAY  . Multiple Vitamin (MULTI-VITAMINS) TABS Take by mouth.  . naproxen sodium (ALEVE) 220 MG tablet Take 220 mg by mouth as needed.   . [DISCONTINUED] linaclotide (LINZESS) 145 MCG CAPS capsule Take 1 capsule (145 mcg total) by mouth daily before breakfast.   No facility-administered encounter medications on file as of 11/30/2018.     Allergies as of 11/30/2018 - Review Complete 11/19/2018  Allergen Reaction Noted  . Lisinopril Other (See Comments) 05/20/2016  . Sulfa antibiotics Other (See Comments) and Rash 10/16/2012    Past Medical History:  Diagnosis Date  . Acid reflux   . Arthritis   . Degenerative joint disease of knee   . Fibromyalgia   . Hiatal hernia     Past Surgical History:  Procedure Laterality Date  . CESAREAN SECTION    . CHOLECYSTECTOMY    . TONSILLECTOMY  1955  . 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 Mental illness Mother   . Heart disease Father   . Prostate cancer Father   . Breast cancer Sister   . Colon cancer Neg Hx   . Stomach cancer Neg Hx   . Rectal cancer Neg Hx   . Esophageal cancer Neg Hx   . Liver cancer Neg Hx     Social History   Socioeconomic History  .  Marital status: Married    Spouse name: Not on file  . Number of children: 2  . Years of education: Not on file  . Highest education level: Not on file  Occupational History  . Occupation: retired  Scientific laboratory technician  . Financial resource strain: Not on file  . Food insecurity    Worry: Not on file    Inability: Not on file  . Transportation needs    Medical: Not on file    Non-medical: Not on file  Tobacco Use  . Smoking status: Never Smoker  . Smokeless tobacco: Never Used  Substance and Sexual Activity  . Alcohol use: Yes    Frequency: Never    Comment: occasional once  monthly  . Drug use: No  . Sexual activity: Not Currently  Lifestyle  . Physical activity    Days per week: Not on file    Minutes per session: Not on file  . Stress: Not on file  Relationships  . Social Herbalist on phone: Not on file    Gets together: Not on file    Attends religious service: Not on file    Active member of club or organization: Not on file    Attends meetings of clubs or organizations: Not on file    Relationship status: Not on file  . Intimate partner violence    Fear of current or ex partner: Not on file    Emotionally abused: Not on file    Physically abused: Not on file    Forced sexual activity: Not on file  Other Topics Concern  . Not on file  Social History Narrative  . Not on file      Review of systems: Review of Systems  Constitutional: Negative for fever and chills.  HENT: Negative.   Eyes: Negative for blurred vision.  Respiratory: Negative for cough, shortness of breath and wheezing.   Cardiovascular: Negative for chest pain and palpitations.  Gastrointestinal: as per HPI Genitourinary: Negative for dysuria, urgency, frequency and hematuria.  Musculoskeletal: Negative for myalgias, back pain and positive for arthritis/ joint pain in small joints of hand.  Skin: Negative for itching and rash.  Neurological: Negative for dizziness, tremors, focal weakness, seizures and loss of consciousness.  Endo/Heme/Allergies: Negative Psychiatric/Behavioral: Negative for depression, suicidal ideas and hallucinations.  All other systems reviewed and are negative.   Physical Exam: Vitals:   11/30/18 1105  BP: 112/70  Pulse: (!) 57  Temp: 97.9 F (36.6 C)   Body mass index is 30.21 kg/m. Gen:      No acute distress HEENT:  EOMI, sclera anicteric Neck:     No masses; no thyromegaly Lungs:    Clear to auscultation bilaterally; normal respiratory effort CV:         Regular rate and rhythm; no murmurs Abd:      + bowel sounds; soft,  non-tender; no palpable masses, no distension Ext:    No edema; adequate peripheral perfusion Skin:      Warm and dry; no rash Neuro: alert and oriented x 3 Psych: normal mood and affect  Data Reviewed:  Reviewed labs, radiology imaging, old records and pertinent past GI work up   Assessment and Plan/Recommendations:  73 year old female with history of nonalcoholic steatohepatitis here for follow-up visit  Overall doing very well, AST and ALT have significantly improved with weight loss and dietary changes  Continue to monitor LFT yearly  Avoid alcohol, herbal hepatotoxins, high carb and high fat  diet  Continue high-protein diet and exercise Start vitamin D 4000 units daily, limited data to support benefit with Misty Rivas  GERD: Symptoms improved, currently stable  Due for surveillance colonoscopy, she was scheduled for it in March but was canceled due to Covid pandemic.  Will need to reschedule it.  Will need extended bowel prep as it was not adequate on exam 5 years ago and hence was recommended 5-year recall   25 minutes was spent face-to-face with the patient. Greater than 50% of the time used for counseling as well as treatment plan and follow-up. She had multiple questions which were answered to her satisfaction  K. Denzil Magnuson , MD    CC: Copland, Gay Filler, MD

## 2018-12-03 ENCOUNTER — Telehealth: Payer: Self-pay | Admitting: *Deleted

## 2018-12-03 NOTE — Telephone Encounter (Signed)
Called patient to schedule her colonoscopy and she wants it done in Feb  Our schedule only went out to jan 11th, Patient will call back mid Dec to check on Feb schedule  She will need to be set up for a Previsit appointment at that time

## 2018-12-05 ENCOUNTER — Encounter: Payer: Self-pay | Admitting: Gastroenterology

## 2018-12-11 DIAGNOSIS — M25561 Pain in right knee: Secondary | ICD-10-CM | POA: Diagnosis not present

## 2018-12-11 DIAGNOSIS — M7651 Patellar tendinitis, right knee: Secondary | ICD-10-CM | POA: Diagnosis not present

## 2018-12-11 DIAGNOSIS — M1711 Unilateral primary osteoarthritis, right knee: Secondary | ICD-10-CM | POA: Diagnosis not present

## 2018-12-13 ENCOUNTER — Ambulatory Visit: Payer: Medicare Other | Admitting: Gastroenterology

## 2018-12-17 ENCOUNTER — Telehealth: Payer: Self-pay | Admitting: Family Medicine

## 2018-12-17 NOTE — Telephone Encounter (Signed)
Called and LMOM Generally avoid tylenol PO NSAIDs ok with caution Would suggest a topical nsaid like voltaren gel as first line for her

## 2018-12-17 NOTE — Telephone Encounter (Signed)
Pt would like to know if there is any pain medication she might could take for hip/knee pain that will not adversely affect her liver. Please advise.   [phone]

## 2018-12-25 ENCOUNTER — Other Ambulatory Visit: Payer: Self-pay | Admitting: Family Medicine

## 2018-12-25 DIAGNOSIS — I10 Essential (primary) hypertension: Secondary | ICD-10-CM

## 2019-01-16 DIAGNOSIS — H40003 Preglaucoma, unspecified, bilateral: Secondary | ICD-10-CM | POA: Diagnosis not present

## 2019-01-23 ENCOUNTER — Other Ambulatory Visit: Payer: Self-pay | Admitting: Family Medicine

## 2019-01-23 MED ORDER — ESCITALOPRAM OXALATE 20 MG PO TABS: ORAL_TABLET | ORAL | 1 refills | Status: DC

## 2019-01-23 NOTE — Telephone Encounter (Signed)
Medication Refill - Medication: escitalopram (LEXAPRO) 20 MG tablet   Has the patient contacted their pharmacy? Yes.   (Agent: If no, request that the patient contact the pharmacy for the refill.) (Agent: If yes, when and what did the pharmacy advise?)  Preferred Pharmacy (with phone number or street name):  [id number] [place], [place] - [address] [address] Phone: [phone]: [phone]    Agent: Please be advised that RX refills may take up to 3 business days. We ask that you follow-up with your pharmacy.

## 2019-01-24 ENCOUNTER — Encounter: Payer: Self-pay | Admitting: Gastroenterology

## 2019-03-04 DIAGNOSIS — Z961 Presence of intraocular lens: Secondary | ICD-10-CM | POA: Diagnosis not present

## 2019-03-04 DIAGNOSIS — T8521XA Breakdown (mechanical) of intraocular lens, initial encounter: Secondary | ICD-10-CM | POA: Diagnosis not present

## 2019-03-20 ENCOUNTER — Encounter: Payer: Self-pay | Admitting: Gastroenterology

## 2019-04-01 DIAGNOSIS — M25462 Effusion, left knee: Secondary | ICD-10-CM | POA: Diagnosis not present

## 2019-04-01 DIAGNOSIS — Z471 Aftercare following joint replacement surgery: Secondary | ICD-10-CM | POA: Diagnosis not present

## 2019-04-01 DIAGNOSIS — Z96652 Presence of left artificial knee joint: Secondary | ICD-10-CM | POA: Diagnosis not present

## 2019-04-01 DIAGNOSIS — M25562 Pain in left knee: Secondary | ICD-10-CM | POA: Diagnosis not present

## 2019-04-01 DIAGNOSIS — Z9181 History of falling: Secondary | ICD-10-CM | POA: Diagnosis not present

## 2019-04-01 DIAGNOSIS — M7062 Trochanteric bursitis, left hip: Secondary | ICD-10-CM | POA: Diagnosis not present

## 2019-04-04 ENCOUNTER — Other Ambulatory Visit: Payer: Self-pay

## 2019-04-04 ENCOUNTER — Ambulatory Visit (AMBULATORY_SURGERY_CENTER): Payer: Self-pay | Admitting: *Deleted

## 2019-04-04 ENCOUNTER — Telehealth: Payer: Self-pay | Admitting: *Deleted

## 2019-04-04 VITALS — Temp 96.6°F | Ht 64.0 in | Wt 182.0 lb

## 2019-04-04 DIAGNOSIS — Z86010 Personal history of colonic polyps: Secondary | ICD-10-CM

## 2019-04-04 MED ORDER — SUPREP BOWEL PREP KIT 17.5-3.13-1.6 GM/177ML PO SOLN: 1.0000 | Freq: Once | ORAL | 0 refills | Status: AC

## 2019-04-04 NOTE — Telephone Encounter (Signed)
cancelled 4-14 colon - MADE PT AN OV 4-28 AT 130 PM WITH  DR Silverio Decamp [facility] WITH PT

## 2019-04-04 NOTE — Telephone Encounter (Signed)
Dr Silverio Decamp,  I saw this pt in [place] today- she is scheduled for a colonoscopy 4-14 Wednesday for a recall colon due to hx colon polyps- last colon 2015- she had an EGD in 2015 as well, regular Z line- hiatal hernia - she has reflux- she asked me about needing an Endoscopy with her colon- she said she has a hx of reflux- she states some days she has reflux s/s other days none but she wants an EGD cause she's always had one with her Colonoscopy's  Please advise if okay to add EGD-  You saw her last in the office 11-30-18 and there is no mention of her needing an EGD   Thanks so much, Lelan Pons .

## 2019-04-04 NOTE — Progress Notes (Signed)
No egg or soy allergy known to patient  No issues with past sedation with any surgeries  or procedures, no intubation problems  No diet pills per patient No home 02 use per patient  No blood thinners per patient  Pt denies issues with constipation  No A fib or A flutter  EMMI video sent to pt's e mail   Due to the COVID-19 pandemic we are asking patients to follow these guidelines. Please only bring one care partner. Please be aware that your care partner may wait in the car in the parking lot or if they feel like they will be too hot to wait in the car, they may wait in the lobby on the 4th floor. All care partners are required to wear a mask the entire time (we do not have any that we can provide them), they need to practice social distancing, and we will do a Covid check for all patient's and care partners when you arrive. Also we will check their temperature and your temperature. If the care partner waits in their car they need to stay in the parking lot the entire time and we will call them on their cell phone when the patient is ready for discharge so they can bring the car to the front of the building. Also all patient's will need to wear a mask into building.  TE to [facility] about adding an EGD

## 2019-04-04 NOTE — Telephone Encounter (Signed)
Please schedule office visit to discuss her upper GI issues and the need for EGD. Thanks

## 2019-04-09 DIAGNOSIS — Z1231 Encounter for screening mammogram for malignant neoplasm of breast: Secondary | ICD-10-CM | POA: Diagnosis not present

## 2019-04-17 ENCOUNTER — Other Ambulatory Visit: Payer: Self-pay

## 2019-04-18 ENCOUNTER — Encounter: Payer: Medicare Other | Admitting: Gastroenterology

## 2019-04-18 ENCOUNTER — Ambulatory Visit (INDEPENDENT_AMBULATORY_CARE_PROVIDER_SITE_OTHER): Payer: Medicare Other | Admitting: Family Medicine

## 2019-04-18 ENCOUNTER — Encounter: Payer: Self-pay | Admitting: Family Medicine

## 2019-04-18 ENCOUNTER — Other Ambulatory Visit: Payer: Self-pay

## 2019-04-18 VITALS — BP 143/82 | HR 50 | Temp 97.5°F | Resp 16 | Ht 64.0 in | Wt 179.0 lb

## 2019-04-18 DIAGNOSIS — R001 Bradycardia, unspecified: Secondary | ICD-10-CM | POA: Diagnosis not present

## 2019-04-18 DIAGNOSIS — K7581 Nonalcoholic steatohepatitis (NASH): Secondary | ICD-10-CM

## 2019-04-18 DIAGNOSIS — I10 Essential (primary) hypertension: Secondary | ICD-10-CM | POA: Diagnosis not present

## 2019-04-18 DIAGNOSIS — Z6379 Other stressful life events affecting family and household: Secondary | ICD-10-CM

## 2019-04-18 DIAGNOSIS — R7401 Elevation of levels of liver transaminase levels: Secondary | ICD-10-CM

## 2019-04-18 DIAGNOSIS — Z131 Encounter for screening for diabetes mellitus: Secondary | ICD-10-CM | POA: Diagnosis not present

## 2019-04-18 DIAGNOSIS — R5383 Other fatigue: Secondary | ICD-10-CM

## 2019-04-18 LAB — CBC
HCT: 45.1 % (ref 36.0–46.0)
Hemoglobin: 14.9 g/dL (ref 12.0–15.0)
MCHC: 33.1 g/dL (ref 30.0–36.0)
MCV: 88.8 fl (ref 78.0–100.0)
Platelets: 350 10*3/uL (ref 150.0–400.0)
RBC: 5.08 Mil/uL (ref 3.87–5.11)
RDW: 13 % (ref 11.5–15.5)
WBC: 12.3 10*3/uL — ABNORMAL HIGH (ref 4.0–10.5)

## 2019-04-18 LAB — COMPREHENSIVE METABOLIC PANEL
ALT: 42 U/L — ABNORMAL HIGH (ref 0–35)
AST: 31 U/L (ref 0–37)
Albumin: 4.4 g/dL (ref 3.5–5.2)
Alkaline Phosphatase: 72 U/L (ref 39–117)
BUN: 16 mg/dL (ref 6–23)
CO2: 27 mEq/L (ref 19–32)
Calcium: 10 mg/dL (ref 8.4–10.5)
Chloride: 103 mEq/L (ref 96–112)
Creatinine, Ser: 0.8 mg/dL (ref 0.40–1.20)
GFR: 70.44 mL/min (ref >60.00)
Glucose, Bld: 79 mg/dL (ref 70–99)
Potassium: 4.2 mEq/L (ref 3.5–5.1)
Sodium: 139 mEq/L (ref 135–145)
Total Bilirubin: 0.8 mg/dL (ref 0.2–1.2)
Total Protein: 6.8 g/dL (ref 6.0–8.3)

## 2019-04-18 LAB — LIPID PANEL
Cholesterol: 266 mg/dL — ABNORMAL HIGH (ref 0–200)
HDL: 67.8 mg/dL (ref >39.00)
LDL Cholesterol: 169 mg/dL — ABNORMAL HIGH (ref 0–99)
NonHDL: 198.67
Total CHOL/HDL Ratio: 4
Triglycerides: 146 mg/dL (ref 0.0–149.0)
VLDL: 29.2 mg/dL (ref 0.0–40.0)

## 2019-04-18 LAB — TSH: TSH: 2.19 u[IU]/mL (ref 0.35–4.50)
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
## 2019-04-18 NOTE — Patient Instructions (Addendum)
It was good to see you again today, I will be in touch with your labs as soon as possible  I believe you are overdue for colonoscopy, as unfortunately your last precolonoscopy prep was not adequate for good pictures.  Please contact Dr. Silverio Decamp at your convenience to set this up- it sounds like you have already done this!    Please consider having the shingles vaccine at your pharmacy at your convenience  I am sorry things are hard at home right now- it sounds like you are making the right decision to me.  Please let me know if you are not ok during this upcoming transition  Please go ahead and increase your lexapro to 20 mg daily - full tablet  Your pulse is on the low side today- please keep an eye on this at home.  If your pulse if going under 50 on a consistent basis this may contribute to fatigue and we should consider stopping metoprolol; we can add more amlodipine or a different BP med to make up the difference in this case

## 2019-04-24 ENCOUNTER — Encounter: Payer: Medicare Other | Admitting: Gastroenterology

## 2019-05-08 ENCOUNTER — Ambulatory Visit: Payer: Medicare Other | Admitting: Gastroenterology

## 2019-06-06 ENCOUNTER — Ambulatory Visit: Payer: Medicare Other | Admitting: *Deleted

## 2019-06-12 DIAGNOSIS — Z471 Aftercare following joint replacement surgery: Secondary | ICD-10-CM | POA: Diagnosis not present

## 2019-06-12 DIAGNOSIS — Z96652 Presence of left artificial knee joint: Secondary | ICD-10-CM | POA: Diagnosis not present

## 2019-06-12 DIAGNOSIS — M25462 Effusion, left knee: Secondary | ICD-10-CM | POA: Diagnosis not present

## 2019-07-03 DIAGNOSIS — K219 Gastro-esophageal reflux disease without esophagitis: Secondary | ICD-10-CM | POA: Diagnosis not present

## 2019-07-03 DIAGNOSIS — K640 First degree hemorrhoids: Secondary | ICD-10-CM | POA: Diagnosis not present

## 2019-07-03 DIAGNOSIS — Z86010 Personal history of colonic polyps: Secondary | ICD-10-CM | POA: Diagnosis not present

## 2019-07-03 DIAGNOSIS — K449 Diaphragmatic hernia without obstruction or gangrene: Secondary | ICD-10-CM | POA: Diagnosis not present

## 2019-07-03 DIAGNOSIS — K2100 Gastro-esophageal reflux disease with esophagitis, without bleeding: Secondary | ICD-10-CM | POA: Diagnosis not present

## 2019-07-03 DIAGNOSIS — K5730 Diverticulosis of large intestine without perforation or abscess without bleeding: Secondary | ICD-10-CM | POA: Diagnosis not present

## 2019-07-03 DIAGNOSIS — D122 Benign neoplasm of ascending colon: Secondary | ICD-10-CM | POA: Diagnosis not present

## 2019-07-03 DIAGNOSIS — Z1211 Encounter for screening for malignant neoplasm of colon: Secondary | ICD-10-CM | POA: Diagnosis not present

## 2019-07-03 DIAGNOSIS — D124 Benign neoplasm of descending colon: Secondary | ICD-10-CM | POA: Diagnosis not present

## 2019-07-03 DIAGNOSIS — K209 Esophagitis, unspecified without bleeding: Secondary | ICD-10-CM | POA: Diagnosis not present

## 2019-07-03 DIAGNOSIS — Z8719 Personal history of other diseases of the digestive system: Secondary | ICD-10-CM | POA: Diagnosis not present

## 2019-07-05 ENCOUNTER — Telehealth (INDEPENDENT_AMBULATORY_CARE_PROVIDER_SITE_OTHER): Payer: Medicare Other | Admitting: Medical

## 2019-07-05 ENCOUNTER — Other Ambulatory Visit: Payer: Self-pay

## 2019-07-05 VITALS — BP 145/82 | HR 71

## 2019-07-05 DIAGNOSIS — R0981 Nasal congestion: Secondary | ICD-10-CM | POA: Diagnosis not present

## 2019-07-05 DIAGNOSIS — J0110 Acute frontal sinusitis, unspecified: Secondary | ICD-10-CM | POA: Diagnosis not present

## 2019-07-05 DIAGNOSIS — R6889 Other general symptoms and signs: Secondary | ICD-10-CM | POA: Diagnosis not present

## 2019-07-05 MED ORDER — LEVOCETIRIZINE DIHYDROCHLORIDE 5 MG PO TABS
5.0000 mg | ORAL_TABLET | Freq: Every evening | ORAL | 0 refills | Status: DC
Start: 2019-07-05 — End: 2019-07-29

## 2019-07-05 MED ORDER — FLUTICASONE PROPIONATE 50 MCG/ACT NA SUSP
2.0000 | Freq: Every day | NASAL | 1 refills | Status: DC
Start: 2019-07-05 — End: 2019-07-29

## 2019-07-05 MED ORDER — AMOXICILLIN-POT CLAVULANATE 875-125 MG PO TABS: 1.0000 | ORAL_TABLET | Freq: Two times a day (BID) | ORAL | 0 refills | Status: DC

## 2019-07-05 NOTE — Patient Instructions (Addendum)
You do have recent 1 week of possible allergy symptoms with sinus pressure/infection.  I prescribed Augmentin antibiotic and Flonase nasal spray.  If you have persisting itchy eyes did add Xyzal antihistamine.  Follow-up in 7 to 10 days or as needed.

## 2019-07-05 NOTE — Progress Notes (Signed)
   Subjective:    Patient ID: Misty Rivas, female    DOB: 1945/08/05, 74 y.o.   MRN: [id number]  HPI  Virtual Visit via Telephone Note  I connected with Misty Rivas on 58/52/77 at 82:42 AM EDT by telephone and verified that I am speaking with the correct person using two identifiers.  Location: Patient: home Provider: virtual   I discussed the limitations, risks, security and privacy concerns of performing an evaluation and management service by telephone and the availability of in person appointments. I also discussed with the patient that there may be a patient responsible charge related to this service. The patient expressed understanding and agreed to proceed.   History of Present Illness:  Pt has been sick for one week with nasal congestion, sinus pressure, ear pain and st. Pt got covid vacine in past already. No sob or wheezing.  Pt has mild cough.   Maxillary sinus pressure on palpation.  Mild Itchy eyes and mild sneeze past 3 days.   Pt does get sinus infection about once a year.   Observations/Objective: General- no acute distress, pleasant, alert and normal speech. heent- self palpation reports frontal sinus pressure.   Assessment and Plan: You do have recent 1 week of possible allergy symptoms with sinus pressure/infection.  I prescribed Augmentin antibiotic and Flonase nasal spray.  If you have persisting itchy eyes did add Xyzal antihistamine.  Follow-up in 7 to 10 days or as needed  Mackie Pai, PA-C  Follow Up Instructions:    I discussed the assessment and treatment plan with the patient. The patient was provided an opportunity to ask questions and all were answered. The patient agreed with the plan and demonstrated an understanding of the instructions.   The patient was advised to call back or seek an in-person evaluation if the symptoms worsen or if the condition fails to improve as anticipated.  I provided 10 minutes of non-face-to-face  time during this encounter.   Mackie Pai, PA-C   Review of Systems     Objective:   Physical Exam        Assessment & Plan:

## 2019-07-16 ENCOUNTER — Encounter (HOSPITAL_BASED_OUTPATIENT_CLINIC_OR_DEPARTMENT_OTHER): Payer: Self-pay

## 2019-07-16 ENCOUNTER — Emergency Department (HOSPITAL_BASED_OUTPATIENT_CLINIC_OR_DEPARTMENT_OTHER)
Admission: EM | Admit: 2019-07-16 | Discharge: 2019-07-16 | Disposition: A | Discharge status: Home / Self Care | Payer: Medicare Other | Attending: Emergency Medicine | Admitting: Emergency Medicine

## 2019-07-16 ENCOUNTER — Emergency Department (HOSPITAL_BASED_OUTPATIENT_CLINIC_OR_DEPARTMENT_OTHER): Payer: Medicare Other

## 2019-07-16 ENCOUNTER — Other Ambulatory Visit: Payer: Self-pay

## 2019-07-16 DIAGNOSIS — W228XXA Striking against or struck by other objects, initial encounter: Secondary | ICD-10-CM | POA: Diagnosis not present

## 2019-07-16 DIAGNOSIS — S0990XA Unspecified injury of head, initial encounter: Secondary | ICD-10-CM

## 2019-07-16 DIAGNOSIS — Z79899 Other long term (current) drug therapy: Secondary | ICD-10-CM | POA: Diagnosis not present

## 2019-07-16 DIAGNOSIS — Z7982 Long term (current) use of aspirin: Secondary | ICD-10-CM | POA: Insufficient documentation

## 2019-07-16 DIAGNOSIS — R42 Dizziness and giddiness: Secondary | ICD-10-CM | POA: Insufficient documentation

## 2019-07-16 DIAGNOSIS — J0100 Acute maxillary sinusitis, unspecified: Secondary | ICD-10-CM | POA: Diagnosis not present

## 2019-07-16 DIAGNOSIS — Y939 Activity, unspecified: Secondary | ICD-10-CM | POA: Diagnosis not present

## 2019-07-16 DIAGNOSIS — Y999 Unspecified external cause status: Secondary | ICD-10-CM | POA: Diagnosis not present

## 2019-07-16 DIAGNOSIS — Y929 Unspecified place or not applicable: Secondary | ICD-10-CM | POA: Insufficient documentation

## 2019-07-16 DIAGNOSIS — I10 Essential (primary) hypertension: Secondary | ICD-10-CM | POA: Insufficient documentation

## 2019-07-16 MED ORDER — PREDNISONE 10 MG (21) PO TBPK
ORAL_TABLET | ORAL | 0 refills | Status: DC
Start: 2019-07-16 — End: 2019-09-08

## 2019-07-16 MED ORDER — AMOXICILLIN-POT CLAVULANATE 875-125 MG PO TABS: 1.0000 | ORAL_TABLET | Freq: Two times a day (BID) | ORAL | 0 refills | Status: DC

## 2019-07-16 NOTE — ED Provider Notes (Signed)
Medical screening examination/treatment/procedure(s) were conducted as a shared visit with non-physician practitioner(s) and myself.  I personally evaluated the patient during the encounter.      Patient seen by me along with physician assistant.  Patient with history of like upper respiratory symptoms for 3 weeks.  On July 3 struck her head on a heavy gate.  Complaint of feeling dizzy and eyes feeling funny.  Spoke with primary care doctor recommended she come in due to the trauma.  CT head without any acute advance.  But did show some evidence of some mild sinusitis.  Based on the duration of her symptoms probably worth a trial of antibiotics and then follow back up with primary care provider.  Patient nontoxic no acute distress.   Fredia Sorrow, MD 07/16/19 [phone]

## 2019-07-16 NOTE — ED Triage Notes (Signed)
Pt c/o "cold" sx x 3 weeks-states she struck head on heavy gait on 7/3-denies LOC-c/o "feeling dizzy and my eyes feel funny" since injury-states she spoke with PCP via phone for flu like sx c/o with rx levocetirizine called in on 6/25-spoke to PCP today for head injury c/o and was advised to come to ED-pt NAD-steady gait

## 2019-07-16 NOTE — Discharge Instructions (Addendum)
Head Injury You have been seen today for a head injury, which may or may not have resulted in a concussion. It does not appear to be serious at this time, however, it is important to note that your presentation today is not necessarily an indication of the severity of future symptoms.  Expected symptoms: Expected symptoms of concussion and/or head injury can include nausea, headache, mild dizziness (should still be able to get up and walk around without difficulty), difficulty concentrating, increased sleep, difficulty sleeping, increased intensity of emotions. Close observation: The close observation period is usually 6 hours from the injury. This includes staying awake and having a trustworthy adult monitor you to assure your condition does not worsen. You should be in regular contact with this person and ideally, they should be able to monitor you in person.  Secondary observation: The secondary observation period is usually 24 hours from the injury. You are allowed to sleep during this time. A trustworthy adult should intermittently monitor you to assure your condition does not worsen.   Overall head injury/concussion care: Rest: Be sure to get plenty of rest. You will need more rest and sleep while you recover. Hydration: Be sure to stay well hydrated by having a goal of drinking about 0.5 liters of water an hour. Pain:  Antiinflammatory medications: Take 600 mg of ibuprofen every 6 hours or 440 mg (over the counter dose) to 500 mg (prescription dose) of naproxen every 12 hours or for the next 3 days. After this time, these medications may be used as needed for pain. Take these medications with food to avoid upset stomach. Choose only one of these medications, do not take them together. Tylenol: Should you continue to have additional pain while taking the ibuprofen or naproxen, you may add in tylenol as needed. Your daily total maximum amount of tylenol from all sources should be limited to  4068m/day for persons without liver problems, or 20020mday for those with liver problems. Return to sports and activities: In general, you may return to normal activities once symptoms have subsided, however, you would ideally be cleared by a primary care provider or other qualified medical professional prior to return to these activities.  Follow up: Follow up with the concussion clinic or your primary care provider for further management of this issue. Return: Return to the ED should you begin to have confusion, abnormal behavior, aggression, violence, or personality changes, repeated vomiting, vision loss, numbness or weakness on one side of the body, difficulty standing due to dizziness, significantly worsening pain, or any other major concerns.  Please take all of your antibiotics until finished!   You may develop abdominal discomfort or diarrhea from the antibiotic.  You may help offset this with probiotics which you can buy or get in yogurt. Do not eat or take the probiotics until 2 hours after your antibiotic.   Hand washing: Wash your hands throughout the day, but especially before and after touching the face, using the restroom, sneezing, coughing, or touching surfaces that have been coughed or sneezed upon. Hydration: Symptoms of most illnesses will be intensified and complicated by dehydration. Dehydration can also extend the duration of symptoms. Drink plenty of fluids and get plenty of rest. You should be drinking at least half a liter of water an hour to stay hydrated. Electrolyte drinks (ex. Gatorade, Powerade, Pedialyte) are also encouraged. You should be drinking enough fluids to make your urine light yellow, almost clear. If this is not the case, you are not  drinking enough water. Please note that some of the treatments indicated below will not be effective if you are not adequately hydrated. Diet: Please concentrate on hydration, however, you may introduce food slowly.  Start with a  clear liquid diet, progressed to a full liquid diet, and then bland solids as you are able. Pain or fever: Ibuprofen, Naproxen, or acetaminophen (generic for Tylenol) for pain or fever.  Antiinflammatory medications: Take 600 mg of ibuprofen every 6 hours or 440 mg (over the counter dose) to 500 mg (prescription dose) of naproxen every 12 hours for the next 3 days. After this time, these medications may be used as needed for pain. Take these medications with food to avoid upset stomach. Choose only one of these medications, do not take them together. Acetaminophen (generic for Tylenol): Should you continue to have additional pain while taking the ibuprofen or naproxen, you may add in acetaminophen as needed. Your daily total maximum amount of acetaminophen from all sources should be limited to 4013m/day for persons without liver problems, or 20049mday for those with liver problems. Prednisone: Take the prednisone, as directed, in its entirety. Zyrtec or Claritin: May add these medication daily to control underlying symptoms of congestion, sneezing, and other signs of allergies.  These medications are available over-the-counter. Generics: Cetirizine (generic for Zyrtec) and loratadine (generic for Claritin). Fluticasone: If you are taking the prednisone, do not take the fluticasone at the same time. Congestion: Plain guaifenesin (generic for plain Mucinex) may help relieve congestion. Saline sinus rinses and saline nasal sprays may also help relieve congestion.  Sore throat: Warm liquids or Chloraseptic spray may help soothe a sore throat. Gargle twice a day with a salt water solution made from a half teaspoon of salt in a cup of warm water.  Follow up: Follow up with a primary care provider within the next two weeks should symptoms fail to resolve. Return: Return to the ED for significantly worsening symptoms, shortness of breath, persistent vomiting, large amounts of blood in stool, or any other major  concerns.  For prescription assistance, may try using prescription discount sites or apps, such as goodrx.com

## 2019-07-16 NOTE — ED Notes (Signed)
ED Provider at bedside. 

## 2019-07-16 NOTE — ED Provider Notes (Signed)
[facility] DEPART[facility] Provider Note   CSN: [id number] Arrival date & time: 07/16/19  1301     History Chief Complaint  Patient presents with  . Cough  . Head Injury    Misty Rivas is a 74 y.o. female.  HPI      Misty Rivas is a 74 y.o. female, with a history of anxiety, HTN, presenting to the ED with 2 complaints.  First, patient complains of sinus pressure, nasal congestion, rhinorrhea, and sore throat for the past 3 weeks.  She also notes she previously had a dry cough, however, this is resolved. She was seen at her PCP office, prescribed Xyzal.  Symptoms have improved somewhat, however, they are persistent. Denies fever, diarrhea, chest pain, shortness of breath, abdominal pain.  Second, patient complains of head injury that occurred July 3.  Patient states she was walking through a gait that then hit her in the side of the head. She endorses some intermittent lightheadedness, especially when standing from a sitting position, momentary, resolves completely.  Also notes some foggy headedness intermittently. Denies anticoagulation. Denies LOC, syncope, confusion, seizure, N/V, neck/back pain, neurologic deficits, vision loss, falls, or any other complaints.   Past Medical History:  Diagnosis Date  . Acid reflux   . Anxiety   . Arthritis   . Cataract    removed both eyes   . Degenerative joint disease of knee   . Depression   . Diverticulosis   . Fibromyalgia   . Gastric polyp   . GERD (gastroesophageal reflux disease)   . Hiatal hernia   . Hypertension    controlled     Patient Active Problem List   Diagnosis Date Noted  . NASH (nonalcoholic steatohepatitis) 04/18/2019  . Essential hypertension 11/23/2016  . Estrogen deficiency 11/23/2016    Past Surgical History:  Procedure Laterality Date  . CESAREAN SECTION     x2  . CHOLECYSTECTOMY    . COLONOSCOPY    . POLYPECTOMY    . TONSILLECTOMY  1955  . TOTAL KNEE ARTHROPLASTY Left  06/19/2017  . UPPER GASTROINTESTINAL ENDOSCOPY       OB History   No obstetric history on file.     Family History  Problem Relation Age of Onset  . Mental illness Mother   . Heart disease Father   . Prostate cancer Father   . Breast cancer Sister   . Colon cancer Neg Hx   . Stomach cancer Neg Hx   . Rectal cancer Neg Hx   . Esophageal cancer Neg Hx   . Liver cancer Neg Hx   . Colon polyps Neg Hx     Social History   Tobacco Use  . Smoking status: Never Smoker  . Smokeless tobacco: Never Used  Vaping Use  . Vaping Use: Never used  Substance Use Topics  . Alcohol use: Yes    Comment: occ  . Drug use: No    Home Medications Prior to Admission medications   Medication Sig Start Date End Date Taking? Authorizing Provider  amLODipine (NORVASC) 5 MG tablet TAKE 1 TABLET (5 MG TOTAL) DAILY BY MOUTH. 02/13/18   Copland, Gay Filler, MD  amoxicillin-clavulanate (AUGMENTIN) 875-125 MG tablet Take 1 tablet by mouth 2 (two) times daily. 07/16/19   Radley Teston C, PA-C  aspirin 81 MG EC tablet Take by mouth.    [provider]  Calcium Carb-Cholecalciferol (CALCIUM-VITAMIN D) 500-200 MG-UNIT tablet Take by mouth.    [provider]  escitalopram (  LEXAPRO) 20 MG tablet TAKE 1 TABLET (20 MG TOTAL) BY MOUTH DAILY. 01/23/19   Copland, Gay Filler, MD  fluticasone (FLONASE) 50 MCG/ACT nasal spray Place 2 sprays into both nostrils daily. 07/05/19   Saguier, Percell Miller, PA-C  levocetirizine (XYZAL) 5 MG tablet Take 1 tablet (5 mg total) by mouth every evening. 07/05/19   Saguier, Percell Miller, PA-C  metoprolol tartrate (LOPRESSOR) 25 MG tablet TAKE 1 TABLET BY MOUTH TWICE A DAY 12/25/18   Copland, Gay Filler, MD  Multiple Vitamin (MULTI-VITAMINS) TABS Take by mouth.    [provider]  naproxen sodium (ALEVE) 220 MG tablet Take 220 mg by mouth as needed.     [provider]  predniSONE (STERAPRED UNI-PAK 21 TAB) 10 MG (21) TBPK tablet Take 6 tabs (25m) day 1, 5 tabs (580m  day 2, 4 tabs (4025mday 3, 3 tabs (65m82may 4, 2 tabs (20mg55my 5, and 1 tab (10mg)109m 6. 07/16/19   Mila Pair C, PA-C    Allergies    Lisinopril and Sulfa antibiotics  Review of Systems   Review of Systems  Constitutional: Negative for chills, diaphoresis and fever.  HENT: Positive for congestion, rhinorrhea, sinus pressure and sore throat. Negative for facial swelling and trouble swallowing.        Head injury  Eyes: Negative for visual disturbance.  Respiratory: Positive for cough (resolved). Negative for shortness of breath.   Cardiovascular: Negative for chest pain.  Gastrointestinal: Negative for abdominal pain, diarrhea, nausea and vomiting.  Musculoskeletal: Negative for back pain and neck pain.  Neurological: Positive for light-headedness and headaches. Negative for seizures, syncope and weakness.  Psychiatric/Behavioral: Negative for confusion.  All other systems reviewed and are negative.   Physical Exam Updated Vital Signs BP (!) 166/91 (BP Location: Right Arm)   Pulse 75   Temp 98 F (36.7 C) (Oral)   Resp 16   Ht 5' 4"  (1.626 m)   Wt 83 kg   SpO2 98%   BMI 31.41 kg/m   Physical Exam Vitals and nursing note reviewed.  Constitutional:      General: She is not in acute distress.    Appearance: She is well-developed. She is not diaphoretic.  HENT:     Head: Normocephalic.     Comments: Patient indicates that the gait hit her on the left side of the forehead.  No swelling, color abnormality, deformity.  The rest of the scalp was examined without signs of injury noted.    Right Ear: Tympanic membrane, ear canal and external ear normal.     Left Ear: Tympanic membrane, ear canal and external ear normal.     Nose: Mucosal edema and congestion present.     Right Sinus: Maxillary sinus tenderness present.     Left Sinus: Maxillary sinus tenderness present.     Mouth/Throat:     Mouth: Mucous membranes are moist.     Pharynx: Oropharynx is clear.  Eyes:      Extraocular Movements: Extraocular movements intact.     Conjunctiva/sclera: Conjunctivae normal.     Pupils: Pupils are equal, round, and reactive to light.  Cardiovascular:     Rate and Rhythm: Normal rate and regular rhythm.     Pulses: Normal pulses.          Radial pulses are 2+ on the right side and 2+ on the left side.       Posterior tibial pulses are 2+ on the right side and 2+ on the left  side.     Comments: Tactile temperature in the extremities appropriate and equal bilaterally. Pulmonary:     Effort: Pulmonary effort is normal. No respiratory distress.     Breath sounds: Normal breath sounds.  Abdominal:     Tenderness: There is no guarding.  Musculoskeletal:     Cervical back: Neck supple.     Right lower leg: No edema.     Left lower leg: No edema.     Comments: Normal motor function intact in all extremities. No midline spinal tenderness.   Lymphadenopathy:     Cervical: No cervical adenopathy.  Skin:    General: Skin is warm and dry.  Neurological:     Mental Status: She is alert and oriented to person, place, and time.     Comments: No noted acute cognitive deficit. Sensation grossly intact to light touch in the extremities.   Grip strengths equal bilaterally.   Strength 5/5 in all extremities.  No gait disturbance.  Coordination intact.  Cranial nerves III-XII grossly intact.  Handles oral secretions without noted difficulty.  No noted phonation or speech deficit. No facial droop.   Psychiatric:        Mood and Affect: Mood and affect normal.        Speech: Speech normal.        Behavior: Behavior normal.     ED Results / Procedures / Treatments   Labs (all labs ordered are listed, but only abnormal results are displayed) Labs Reviewed - No data to display  EKG None  Radiology CT Head Wo Contrast  Result Date: 07/16/2019 CLINICAL DATA:  Dizziness, after head injury several days ago. No loss of consciousness. EXAM: CT HEAD WITHOUT CONTRAST  TECHNIQUE: Contiguous axial images were obtained from the base of the skull through the vertex without intravenous contrast. COMPARISON:  None. FINDINGS: Brain: Mild diffuse cortical atrophy is noted. Mild chronic ischemic white matter disease is noted. No mass effect or midline shift is noted. Ventricular size is within normal limits. There is no evidence of mass lesion, hemorrhage or acute infarction. Vascular: No hyperdense vessel or unexpected calcification. Skull: Normal. Negative for fracture or focal lesion. Sinuses/Orbits: Mild sinusitis is noted in right maxillary, sphenoid and bilateral ethmoid sinuses. Other: None. IMPRESSION: Mild diffuse cortical atrophy. Mild chronic ischemic white matter disease. No acute intracranial abnormality seen. Electronically Signed   By: Marijo Conception M.D.   On: 07/16/2019 15:00    Procedures Procedures (including critical care time)  Medications Ordered in ED Medications - No data to display  ED Course  I have reviewed the triage vital signs and the nursing notes.  Pertinent labs & imaging results that were available during my care of the patient were reviewed by me and considered in my medical decision making (see chart for details).    MDM Rules/Calculators/A&P                          Patient complains of sinus pressure, nasal congestion, sore throat.  I suspect the sore throat and the resolved cough may have been due to postnasal drip. Patient is nontoxic appearing, afebrile, not tachycardic, not tachypneic, not hypotensive, maintains excellent SPO2 on room air, and is in no apparent distress.  We did not perform a chest x-ray because she no longer has chest symptoms.  I have reviewed the patient's chart to obtain more information.   Due to the duration of the patient's above symptoms, I  suggested initiating an antibiotic.  I also discussed initiation of a nasal steroid.  Upon chart review, however, it was noted patient had been prescribed both  Flonase and Augmentin on June 25.  When I discussed this with the patient, she states she had forgotten about the Flonase and did not pick up the Augmentin because she thought when they called her about it that it was her amlodipine. I re-sent the prescription for Augmentin to the pharmacy.  I discussed with the patient that she could use the Flonase or the prednisone, but not both.  I reviewed and interpreted the patient's radiological studies. Patient has no focal neurologic deficits associated with her head injury.  Due to her age, however, shared decision-making conversation was had regarding imaging.  Patient opted for head CT.  Head CT without acute abnormality.   Findings and plan of care discussed with Fredia Sorrow, MD.   Vitals:   07/16/19 1308 07/16/19 1311 07/16/19 1538  BP: (!) 166/91  (!) 138/103  Pulse: 75  (!) 53  Resp: 16  19  Temp: 98 F (36.7 C)  97.9 F (36.6 C)  TempSrc: Oral  Oral  SpO2: 98%  100%  Weight:  83 kg   Height:  5' 4"  (1.626 m)      Final Clinical Impression(s) / ED Diagnoses Final diagnoses:  Acute non-recurrent maxillary sinusitis  Injury of head, initial encounter    Rx / DC Orders ED Discharge Orders         Ordered    predniSONE (STERAPRED UNI-PAK 21 TAB) 10 MG (21) TBPK tablet     Discontinue  Reprint     07/16/19 1543    amoxicillin-clavulanate (AUGMENTIN) 875-125 MG tablet  2 times daily     Discontinue  Reprint     07/16/19 [place], Marquett Bertoli C, PA-C 07/16/19 1556    Fredia Sorrow, MD 07/17/19 [phone]

## 2019-07-18 ENCOUNTER — Other Ambulatory Visit: Payer: Self-pay

## 2019-07-18 ENCOUNTER — Encounter: Payer: Self-pay | Admitting: Family Medicine

## 2019-07-18 ENCOUNTER — Ambulatory Visit (INDEPENDENT_AMBULATORY_CARE_PROVIDER_SITE_OTHER): Payer: Medicare Other | Admitting: Family Medicine

## 2019-07-18 VITALS — BP 126/80 | HR 71 | Resp 15 | Ht 64.0 in | Wt 182.0 lb

## 2019-07-18 DIAGNOSIS — S060X0D Concussion without loss of consciousness, subsequent encounter: Secondary | ICD-10-CM

## 2019-07-18 NOTE — Patient Instructions (Signed)
It was good to see you again today, I am glad that you are feeling better It sounds as though you had a concussion-this is a type of injury to your brain which can cause symptoms of fatigue, headache, dizziness, confusion or mental slowing.  Typically it usually get back to normal with time, but give yourself the time necessary to rest.  Avoid any heavy physical exertion, and also avoid excessive use of screens such as TV or computer.  Nap if you feel sleepy  I expect you will be back to normal by sometime next week, please let me know if this is not the case or if you start getting worse

## 2019-07-18 NOTE — Progress Notes (Signed)
[facility] [address], [place], [place] [address] [phone]  Date:  07/18/2019   Name:  Misty Rivas   DOB:  [birth year]   MRN:  [id number]  PCP:  Darreld Mclean, MD    Chief Complaint: Concussion (follow up ER, head injury)   History of Present Illness:  Vita Currin is a 74 y.o. very pleasant female patient who presents with the following:  Pt with history of HTN and NASH Here today to follow-up from ER visit on 7/6- she was seen with concern of head injury on 7/3.  She was walking through a heavy metal gate and it swung back and hit her head. CT head was negative except for sinusitis - she was rx augmentin by the ER, she is also taking prednisone  She does feel like the medication is helping her  She is feeling much less dizzy, HA are much better- she may have a mild HA off and on now  She has been using aleve but no longer needs She is feeling a bit mentally slowed- likely had a concussion Never had a concussion in the past  Her blood pressure has been under good control  She is steadily improving from her head injury  Patient Active Problem List   Diagnosis Date Noted  . NASH (nonalcoholic steatohepatitis) 04/18/2019  . Essential hypertension 11/23/2016  . Estrogen deficiency 11/23/2016    Past Medical History:  Diagnosis Date  . Acid reflux   . Anxiety   . Arthritis   . Cataract    removed both eyes   . Degenerative joint disease of knee   . Depression   . Diverticulosis   . Fibromyalgia   . Gastric polyp   . GERD (gastroesophageal reflux disease)   . Hiatal hernia   . Hypertension    controlled     Past Surgical History:  Procedure Laterality Date  . CESAREAN SECTION     x2  . CHOLECYSTECTOMY    . COLONOSCOPY    . POLYPECTOMY    . TONSILLECTOMY  1955  . 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 UPPER GASTROINTESTINAL ENDOSCOPY      Social History   Tobacco Use  .  Smoking status: Never Smoker  . Smokeless tobacco: Never Used  Vaping Use  . Vaping Use: Never used  Substance Use Topics  . Alcohol use: Yes    Comment: occ  . Drug use: No    Family History  Problem Relation Age of Onset  . Mental illness Mother   . Heart disease Father   . Prostate cancer Father   . Breast cancer Sister   . Colon cancer Neg Hx   . Stomach cancer Neg Hx   . Rectal cancer Neg Hx   . Esophageal cancer Neg Hx   . Liver cancer Neg Hx   . Colon polyps Neg Hx     Allergies  Allergen Reactions  . Lisinopril Other (See Comments)    Per pt cannot tolerate medication makes her fatigued  . Sulfa Antibiotics Other (See Comments) and Rash    Other reaction(s): Other (See Comments) Unknown reaction per pt, hasnt taken in a long time. Unknown reaction per pt, hasnt taken in a long time.     Medication list has been reviewed and updated.  Current Outpatient Medications on File Prior to Visit  Medication Sig Dispense Refill  . amLODipine (NORVASC) 5 MG tablet TAKE  1 TABLET (5 MG TOTAL) DAILY BY MOUTH. 90 tablet 1  . amoxicillin-clavulanate (AUGMENTIN) 875-125 MG tablet Take 1 tablet by mouth 2 (two) times daily. 20 tablet 0  . aspirin 81 MG EC tablet Take by mouth.    . Calcium Carb-Cholecalciferol (CALCIUM-VITAMIN D) 500-200 MG-UNIT tablet Take by mouth.    . escitalopram (LEXAPRO) 20 MG tablet TAKE 1 TABLET (20 MG TOTAL) BY MOUTH DAILY. 90 tablet 1  . fluticasone (FLONASE) 50 MCG/ACT nasal spray Place 2 sprays into both nostrils daily. 16 g 1  . levocetirizine (XYZAL) 5 MG tablet Take 1 tablet (5 mg total) by mouth every evening. 30 tablet 0  . metoprolol tartrate (LOPRESSOR) 25 MG tablet TAKE 1 TABLET BY MOUTH TWICE A DAY 180 tablet 2  . Multiple Vitamin (MULTI-VITAMINS) TABS Take by mouth.    . naproxen sodium (ALEVE) 220 MG tablet Take 220 mg by mouth as needed.     . predniSONE (STERAPRED UNI-PAK 21 TAB) 10 MG (21) TBPK tablet Take 6 tabs (29m) day 1, 5 tabs  (526m day 2, 4 tabs (4034mday 3, 3 tabs (65m31may 4, 2 tabs (20mg33my 5, and 1 tab (10mg)19m 6. 21 tablet 0   No current facility-administered medications on file prior to visit.    Review of Systems:  As per HPI- otherwise negative.   Physical Examination: Vitals:   07/18/19 1401  BP: 126/80  Pulse: 71  Resp: 15  SpO2: 98%   Vitals:   07/18/19 1401  Weight: 182 lb (82.6 kg)  Height: 5' 4"  (1.626 m)   Body mass index is 31.24 kg/m. Ideal Body Weight: Weight in (lb) to have BMI = 25: 145.3  GEN: no acute distress.  Overweight, looks well HEENT: Atraumatic, Normocephalic.   Bilateral TM wnl, oropharynx normal.  PEERL,EOMI.   Ears and Nose: No external deformity. CV: RRR, No M/G/R. No JVD. No thrill. No extra heart sounds. PULM: CTA B, no wheezes, crackles, rhonchi. No retractions. No resp. distress. No accessory muscle use. ABD: S, NT, ND, +BS. No rebound. No HSM. EXTR: No c/c/e PSYCH: Normally interactive. Conversant.  Negative Romberg, normal strength, sensation, DTR of all limbs.  Assessment and Plan: Concussion without loss of consciousness, subsequent encounter    Patient following up today from her recent concussion She was seen in the ER, underwent a head CT which was negative except for sinusitis.  She is currently taking prednisone and antibiotics She is improving but not yet at 100% well Her son drove her here today, she wonders when she is okay to drive again Advised her that concussions can take a very mild time to resolve.  I encouraged her not to drive, she feels alert and confident, and to start driving short distances to familiar locations. Otherwise, I encouraged her to rest physically and mentally until her symptoms resolve. Advised her to contact me if her symptoms do not resolve within a week, sooner if worse I advised her if any sharp increase in headaches, vomiting, or other worsening symptoms return to ER  This visit occurred during the  SARS-CoV-2 public health emergency.  Safety protocols were in place, including screening questions prior to the visit, additional usage of staff PPE, and extensive cleaning of exam room while observing appropriate contact time as indicated for disinfecting solutions.    Signed JessicLamar Blinks

## 2019-07-27 ENCOUNTER — Other Ambulatory Visit: Payer: Self-pay | Admitting: Medical

## 2019-08-12 ENCOUNTER — Other Ambulatory Visit: Payer: Self-pay

## 2019-08-12 ENCOUNTER — Other Ambulatory Visit: Payer: Self-pay | Admitting: Medical

## 2019-08-12 MED ORDER — LEVOCETIRIZINE DIHYDROCHLORIDE 5 MG PO TABS: ORAL_TABLET | ORAL | 0 refills | Status: AC

## 2019-08-23 ENCOUNTER — Other Ambulatory Visit: Payer: Self-pay | Admitting: Family Medicine

## 2019-09-07 DIAGNOSIS — Z20822 Contact with and (suspected) exposure to covid-19: Secondary | ICD-10-CM | POA: Diagnosis not present

## 2019-09-08 NOTE — Patient Instructions (Addendum)
It was good to see you again today!  I will be in touch with your labs and x-rays, bone density results I suspect your neck symptoms are due to muscle spasm- try heat, and the robaxin as needed (muscle relaxer)- this can make you drowsy!  We will make sure your thyroid and iron levels are ok- assuming so, please purchase topical rogaine for women and try it for your hair   Please see me in about 6 months Don't forget covid booster and flu shot this fall

## 2019-09-08 NOTE — Progress Notes (Addendum)
[place] at [facility] [address] [place], [place] [address] [phone]  Date:  09/09/2019   Name:  Misty Rivas   DOB:  [birth year]   MRN:  [id number]  PCP:  Darreld Mclean, MD    Chief Complaint: Neck Pain (neck pain, 3 -4 weeks) and Loss of Hair   History of Present Illness:  Misty Rivas is a 74 y.o. very pleasant female patient who presents with the following:  Patient with history of hypertension, [name].  Here today with concern of a bump on her head Last seen by myself in July to follow-up recent ER visit: Here today to follow-up from ER visit on 7/6- she was seen with concern of head injury on 7/3.  She was walking through a heavy metal gate and it swung back and hit her head. CT head was negative except for sinusitis - she was rx augmentin by the ER, she is also taking prednisone  She does feel like the medication is helping her  She is feeling much less dizzy, HA are much better- she may have a mild HA off and on now  She has been using aleve but no longer needs She is feeling a bit mentally slowed- likely had a concussion Never had a concussion in the past Her blood pressure has been under good control She is steadily improving from her head injury  COVID-19 primary series completed in February Colonoscopy up-to-date Mammogram up-to-date Can offer to update DEXA scan-patient would like order Complete labs done in April  She has noted some neck pain for about one month- this seems to be worse when she first wakes up in the am and will get better as the day goes on She tried a new pillow, etc but has not helped.  She is not aware of any injury She does not have any sx down her arms, no numbness or weakness Her shoulders do feel sore This neck pain does not seem connected to her head injury from July  She also notes her hair seems to be falling out more than normal for about 6 weeks Not bald spots, more of  a generalized thinning especially along the front of her head. Patient Active Problem List   Diagnosis Date Noted  . NASH (nonalcoholic steatohepatitis) 04/18/2019  . Essential hypertension 11/23/2016  . Estrogen deficiency 11/23/2016    Past Medical History:  Diagnosis Date  . Acid reflux   . Anxiety   . Arthritis   . Cataract    removed both eyes   . Degenerative joint disease of knee   . Depression   . Diverticulosis   . Fibromyalgia   . Gastric polyp   . GERD (gastroesophageal reflux disease)   . Hiatal hernia   . Hypertension    controlled     Past Surgical History:  Procedure Laterality Date  . CESAREAN SECTION     x2  . CHOLECYSTECTOMY    . COLONOSCOPY    . POLYPECTOMY    . TONSILLECTOMY  1955  . 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 UPPER GASTROINTESTINAL ENDOSCOPY      Social History   Tobacco Use  . Smoking status: Never Smoker  . Smokeless tobacco: Never Used  Vaping Use  . Vaping Use: Never used  Substance Use Topics  . Alcohol use: Yes    Comment: occ  . Drug use: No    Family History  Problem Relation  Age of Onset  . Mental illness Mother   . Heart disease Father   . Prostate cancer Father   . Breast cancer Sister   . Colon cancer Neg Hx   . Stomach cancer Neg Hx   . Rectal cancer Neg Hx   . Esophageal cancer Neg Hx   . Liver cancer Neg Hx   . Colon polyps Neg Hx     Allergies  Allergen Reactions  . Lisinopril Other (See Comments)    Per pt cannot tolerate medication makes her fatigued  . Sulfa Antibiotics Other (See Comments) and Rash    Other reaction(s): Other (See Comments) Unknown reaction per pt, hasnt taken in a long time. Unknown reaction per pt, hasnt taken in a long time.     Medication list has been reviewed and updated.  Current Outpatient Medications on File Prior to Visit  Medication Sig Dispense Refill  . amLODipine (NORVASC) 5 MG tablet TAKE 1 TABLET (5 MG TOTAL) DAILY BY MOUTH. 90 tablet 1  . aspirin  81 MG EC tablet Take by mouth.    . Calcium Carb-Cholecalciferol (CALCIUM-VITAMIN D) 500-200 MG-UNIT tablet Take by mouth.    . escitalopram (LEXAPRO) 20 MG tablet TAKE 1 TABLET BY MOUTH EVERY DAY 90 tablet 0  . fluticasone (FLONASE) 50 MCG/ACT nasal spray SPRAY 2 SPRAYS INTO EACH NOSTRIL EVERY DAY 16 mL 1  . levocetirizine (XYZAL) 5 MG tablet TAKE 1 TABLET BY MOUTH EVERY DAY IN THE EVENING 30 tablet 0  . metoprolol tartrate (LOPRESSOR) 25 MG tablet TAKE 1 TABLET BY MOUTH TWICE A DAY 180 tablet 2  . Multiple Vitamin (MULTI-VITAMINS) TABS Take by mouth.    . naproxen sodium (ALEVE) 220 MG tablet Take 220 mg by mouth as needed.      No current facility-administered medications on file prior to visit.    Review of Systems:  As per HPI- otherwise negative.   Physical Examination: Vitals:   09/09/19 0825  BP: 134/80  Pulse: 64  Resp: 16  SpO2: 98%   Vitals:   09/09/19 0825  Weight: 185 lb (83.9 kg)  Height: 5' 4"  (1.626 m)   Body mass index is 31.76 kg/m. Ideal Body Weight: Weight in (lb) to have BMI = 25: 145.3  GEN: no acute distress.  Mild overweight, looks well HEENT: Atraumatic, Normocephalic.   PEERL, TM within normal limits bilaterally There is moderate thinning of hair along the front and center of the scalp.  No bald spots are present, hair pull test is normal Ears and Nose: No external deformity. CV: RRR, No M/G/R. No JVD. No thrill. No extra heart sounds. PULM: CTA B, no wheezes, crackles, rhonchi. No retractions. No resp. distress. No accessory muscle use. ABD: S, NT, ND, +BS. No rebound. No HSM. EXTR: No c/c/e PSYCH: Normally interactive. Conversant.  Normal cervical spine range of motion, normal strength of bilateral upper extremities.  Normal biceps DTR She has tenderness in a distinct area of the right cervical paraspinous muscles, along the superior aspect of the cervical spine No skin changes, redness or rash is present   Assessment and Plan: Muscle  spasms of neck - Plan: DG Cervical Spine Complete, methocarbamol (ROBAXIN) 500 MG tablet  Hair loss - Plan: Ferritin, TSH, CANCELED: TSH, CANCELED: Ferritin  Estrogen deficiency - Plan: DG Bone Density  Patient here today with a couple of concerns Ordered bone density Check TSH and ferritin for hair loss.  Assuming these are normal, recommend Rogaine topically over-the-counter Neck pain,  suspect due to spasm of paracervical muscles.  We will obtain plain films to evaluate for degenerative change of the spine.  Recommended heat, Robaxin as needed.  Cautioned that this can cause sedation.  If symptoms persist beyond another week or 2, might try physical therapy and/or massage therapy  Discussed boosters of her immunizations as needed this fall  Will plan further follow- up pending labs.  This visit occurred during the SARS-CoV-2 public health emergency.  Safety protocols were in place, including screening questions prior to the visit, additional usage of staff PPE, and extensive cleaning of exam room while observing appropriate contact time as indicated for disinfecting solutions.    Signed Lamar Blinks, MD  8/31- received her labs as below, message to pt  Results for orders placed or performed in visit on 09/09/19  Ferritin  Result Value Ref Range   Ferritin 89 16 - 288 ng/mL  TSH  Result Value Ref Range   TSH 3.30 0.40 - 4.50 mIU/L

## 2019-09-09 ENCOUNTER — Encounter: Payer: Self-pay | Admitting: Family Medicine

## 2019-09-09 ENCOUNTER — Ambulatory Visit (HOSPITAL_BASED_OUTPATIENT_CLINIC_OR_DEPARTMENT_OTHER)
Admission: RE | Admit: 2019-09-09 | Discharge: 2019-09-09 | Disposition: A | Discharge status: Home / Self Care | Payer: Medicare Other | Source: Ambulatory Visit | Attending: Family Medicine | Admitting: Family Medicine

## 2019-09-09 ENCOUNTER — Other Ambulatory Visit: Payer: Self-pay

## 2019-09-09 ENCOUNTER — Ambulatory Visit (INDEPENDENT_AMBULATORY_CARE_PROVIDER_SITE_OTHER): Payer: Medicare Other | Admitting: Family Medicine

## 2019-09-09 VITALS — BP 134/80 | HR 64 | Resp 16 | Ht 64.0 in | Wt 185.0 lb

## 2019-09-09 DIAGNOSIS — E2839 Other primary ovarian failure: Secondary | ICD-10-CM

## 2019-09-09 DIAGNOSIS — M62838 Other muscle spasm: Secondary | ICD-10-CM

## 2019-09-09 DIAGNOSIS — L659 Nonscarring hair loss, unspecified: Secondary | ICD-10-CM

## 2019-09-09 DIAGNOSIS — M85852 Other specified disorders of bone density and structure, left thigh: Secondary | ICD-10-CM | POA: Diagnosis not present

## 2019-09-09 DIAGNOSIS — Z780 Asymptomatic menopausal state: Secondary | ICD-10-CM | POA: Diagnosis not present

## 2019-09-09 DIAGNOSIS — Z8262 Family history of osteoporosis: Secondary | ICD-10-CM | POA: Diagnosis not present

## 2019-09-09 DIAGNOSIS — R29890 Loss of height: Secondary | ICD-10-CM | POA: Diagnosis not present

## 2019-09-09 DIAGNOSIS — M8580 Other specified disorders of bone density and structure, unspecified site: Secondary | ICD-10-CM | POA: Insufficient documentation

## 2019-09-09 DIAGNOSIS — M47812 Spondylosis without myelopathy or radiculopathy, cervical region: Secondary | ICD-10-CM | POA: Diagnosis not present

## 2019-09-09 LAB — FERRITIN: Ferritin: 89 ng/mL (ref 16–288)

## 2019-09-09 LAB — TSH: TSH: 3.3 mIU/L (ref 0.40–4.50)

## 2019-09-09 MED ORDER — METHOCARBAMOL 500 MG PO TABS: 500.0000 mg | ORAL_TABLET | Freq: Three times a day (TID) | ORAL | 0 refills | Status: DC | PRN

## 2019-09-10 ENCOUNTER — Encounter: Payer: Self-pay | Admitting: Family Medicine

## 2019-09-13 ENCOUNTER — Telehealth: Payer: Self-pay

## 2019-09-13 NOTE — Telephone Encounter (Signed)
Pt called for results- she has been unable to get into mychart. I informed her of lab results, and xray results/recommendations. Pt verbalized understanding.

## 2019-10-20 DIAGNOSIS — Z23 Encounter for immunization: Secondary | ICD-10-CM | POA: Diagnosis not present

## 2019-11-24 ENCOUNTER — Other Ambulatory Visit: Payer: Self-pay | Admitting: Family Medicine

## 2019-12-09 ENCOUNTER — Telehealth (INDEPENDENT_AMBULATORY_CARE_PROVIDER_SITE_OTHER): Payer: Medicare Other | Admitting: Family Medicine

## 2019-12-09 DIAGNOSIS — Z1159 Encounter for screening for other viral diseases: Secondary | ICD-10-CM | POA: Diagnosis not present

## 2019-12-09 DIAGNOSIS — R059 Cough, unspecified: Secondary | ICD-10-CM | POA: Diagnosis not present

## 2019-12-09 LAB — NOVEL CORONAVIRUS, NAA: SARS-CoV-2, NAA: POSITIVE

## 2019-12-09 MED ORDER — HYDROCOD POLST-CPM POLST ER 10-8 MG/5ML PO SUER: 2.5000 mL | Freq: Two times a day (BID) | ORAL | 0 refills | Status: DC | PRN

## 2019-12-09 MED ORDER — DOXYCYCLINE HYCLATE 100 MG PO CAPS: 100.0000 mg | ORAL_CAPSULE | Freq: Two times a day (BID) | ORAL | 0 refills | Status: DC

## 2019-12-09 NOTE — Progress Notes (Signed)
[place] at [facility] [address], [place], [place] [address] [phone]  Date:  12/09/2019   Name:  Misty Rivas   DOB:  [birth year]   MRN:  [id number]  PCP:  Darreld Mclean, MD    Chief Complaint: No chief complaint on file.   History of Present Illness:  Misty Rivas is a 74 y.o. very pleasant female patient who presents with the following:  Visit today for concern of illness-virtual visit.  Patient location is home, provider location is office.  Connected with patient via telephone today, the patient myself are present on the phone call Last seen by myself in August of this year   She got her covid booster last week-right after this vaccine (she is not sure if it is related) she began to feel ill She got a scab from the shot  She has noted sore throat, fever, headache, vomiting Clarified vomiting -she is having post- tussive emesis only Subjective temperature-she has not actually checked her temperature Some diarrhea mild  She is using aleve OTC, otherwise not any medications use at this time  She has history of hypertension, NASH-otherwise generally in good health  Patient Active Problem List   Diagnosis Date Noted  . Osteopenia 09/09/2019  . NASH (nonalcoholic steatohepatitis) 04/18/2019  . Essential hypertension 11/23/2016  . Estrogen deficiency 11/23/2016    Past Medical History:  Diagnosis Date  . Acid reflux   . Anxiety   . Arthritis   . Cataract    removed both eyes   . Degenerative joint disease of knee   . Depression   . Diverticulosis   . Fibromyalgia   . Gastric polyp   . GERD (gastroesophageal reflux disease)   . Hiatal hernia   . Hypertension    controlled     Past Surgical History:  Procedure Laterality Date  . CESAREAN SECTION     x2  . CHOLECYSTECTOMY    . COLONOSCOPY    . POLYPECTOMY    . TONSILLECTOMY  1955  . 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 UPPER  GASTROINTESTINAL ENDOSCOPY      Social History   Tobacco Use  . Smoking status: Never Smoker  . Smokeless tobacco: Never Used  Vaping Use  . Vaping Use: Never used  Substance Use Topics  . Alcohol use: Yes    Comment: occ  . Drug use: No    Family History  Problem Relation Age of Onset  . Mental illness Mother   . Heart disease Father   . Prostate cancer Father   . Breast cancer Sister   . Colon cancer Neg Hx   . Stomach cancer Neg Hx   . Rectal cancer Neg Hx   . Esophageal cancer Neg Hx   . Liver cancer Neg Hx   . Colon polyps Neg Hx     Allergies  Allergen Reactions  . Lisinopril Other (See Comments)    Per pt cannot tolerate medication makes her fatigued  . Sulfa Antibiotics Other (See Comments) and Rash    Other reaction(s): Other (See Comments) Unknown reaction per pt, hasnt taken in a long time. Unknown reaction per pt, hasnt taken in a long time.     Medication list has been reviewed and updated.  Current Outpatient Medications on File Prior to Visit  Medication Sig Dispense Refill  . amLODipine (NORVASC) 5 MG tablet TAKE 1 TABLET (5 MG TOTAL) DAILY BY MOUTH. 90 tablet  1  . aspirin 81 MG EC tablet Take by mouth.    . Calcium Carb-Cholecalciferol (CALCIUM-VITAMIN D) 500-200 MG-UNIT tablet Take by mouth.    . escitalopram (LEXAPRO) 20 MG tablet Take 1 tablet (20 mg total) by mouth daily. 90 tablet 1  . fluticasone (FLONASE) 50 MCG/ACT nasal spray SPRAY 2 SPRAYS INTO EACH NOSTRIL EVERY DAY 16 mL 1  . levocetirizine (XYZAL) 5 MG tablet TAKE 1 TABLET BY MOUTH EVERY DAY IN THE EVENING 30 tablet 0  . methocarbamol (ROBAXIN) 500 MG tablet Take 1 tablet (500 mg total) by mouth every 8 (eight) hours as needed for muscle spasms. 30 tablet 0  . metoprolol tartrate (LOPRESSOR) 25 MG tablet TAKE 1 TABLET BY MOUTH TWICE A DAY 180 tablet 2  . Multiple Vitamin (MULTI-VITAMINS) TABS Take by mouth.    . naproxen sodium (ALEVE) 220 MG tablet Take 220 mg by mouth as needed.       No current facility-administered medications on file prior to visit.    Review of Systems:  As per HPI- otherwise negative.   Physical Examination: There were no vitals filed for this visit. There were no vitals filed for this visit. There is no height or weight on file to calculate BMI. Ideal Body Weight:    Spoke with patient over telephone.  She sounds well, no cough or distress is noted She is not currently checking any vital signs Assessment and Plan: Cough - Plan: doxycycline (VIBRAMYCIN) 100 MG capsule, chlorpheniramine-HYDROcodone (TUSSIONEX PENNKINETIC ER) 10-8 MG/5ML SUER  Telephone visit today for concern of cough and illness.  Advised patient that she should be tested for COVID-19 as soon as possible.  Gave a couple of community resources for COVID-19 testing.  She plans to have this done ASAP, if positive she will call me and I can help set up for antibody infusion.  Assuming negative we will plan to treat for bronchitis.  I called in doxycycline twice a day for 10 days  Also called in Tussionex cough syrup to use as needed.  Advised patient that this medication can cause drowsiness  Await the results of her COVID-19 test-she will let me know if any worsening or other concerns in the meantime I encouraged her to check her temperature with a thermometer so we can determine if she is running true fevers Spoke with patient on the telephone for about 7 minutes today Signed Lamar Blinks, MD

## 2019-12-10 ENCOUNTER — Telehealth: Payer: Self-pay | Admitting: Family Medicine

## 2019-12-10 ENCOUNTER — Encounter: Payer: Self-pay | Admitting: Family Medicine

## 2019-12-10 NOTE — Telephone Encounter (Signed)
Patient is calling to let you know her covid test was positive

## 2019-12-11 ENCOUNTER — Encounter: Payer: Self-pay | Admitting: Family Medicine

## 2019-12-11 ENCOUNTER — Telehealth (HOSPITAL_COMMUNITY): Payer: Self-pay

## 2019-12-11 ENCOUNTER — Ambulatory Visit (HOSPITAL_COMMUNITY)
Admission: RE | Admit: 2019-12-11 | Discharge: 2019-12-11 | Disposition: A | Discharge status: Home / Self Care | Payer: Medicare Other | Source: Ambulatory Visit | Attending: Pulmonary Disease | Admitting: Pulmonary Disease

## 2019-12-11 ENCOUNTER — Other Ambulatory Visit: Payer: Self-pay | Admitting: Physician Assistant

## 2019-12-11 DIAGNOSIS — U071 COVID-19: Secondary | ICD-10-CM

## 2019-12-11 DIAGNOSIS — K7581 Nonalcoholic steatohepatitis (NASH): Secondary | ICD-10-CM

## 2019-12-11 DIAGNOSIS — Z23 Encounter for immunization: Secondary | ICD-10-CM | POA: Insufficient documentation

## 2019-12-11 DIAGNOSIS — I10 Essential (primary) hypertension: Secondary | ICD-10-CM

## 2019-12-11 MED ORDER — SOTROVIMAB 500 MG/8ML IV SOLN
500.0000 mg | Freq: Once | INTRAVENOUS | Status: AC
  Administered 2019-12-11: 500 mg via INTRAVENOUS

## 2019-12-11 MED ORDER — DIPHENHYDRAMINE HCL 50 MG/ML IJ SOLN: 50.0000 mg | Freq: Once | INTRAMUSCULAR | Status: DC | PRN

## 2019-12-11 MED ORDER — METHYLPREDNISOLONE SODIUM SUCC 125 MG IJ SOLR: 125.0000 mg | Freq: Once | INTRAMUSCULAR | Status: DC | PRN

## 2019-12-11 MED ORDER — EPINEPHRINE 0.3 MG/0.3ML IJ SOAJ: 0.3000 mg | Freq: Once | INTRAMUSCULAR | Status: DC | PRN

## 2019-12-11 MED ORDER — SODIUM CHLORIDE 0.9 % IV SOLN: INTRAVENOUS | Status: DC | PRN

## 2019-12-11 MED ORDER — FAMOTIDINE IN NACL 20-0.9 MG/50ML-% IV SOLN: 20.0000 mg | Freq: Once | INTRAVENOUS | Status: DC | PRN

## 2019-12-11 MED ORDER — ALBUTEROL SULFATE HFA 108 (90 BASE) MCG/ACT IN AERS: 2.0000 | INHALATION_SPRAY | Freq: Once | RESPIRATORY_TRACT | Status: DC | PRN

## 2019-12-11 NOTE — Progress Notes (Signed)
Diagnosis: COVID-19  Physician: Dr. Patrick Wright  Procedure: [facility] Infusion [facility] Med: Sotrovimab infusion - Provided patient with sotrovimab fact sheet for patients, parents, and caregivers prior to infusion.   Complications: No immediate complications noted  Discharge: Discharged home    

## 2019-12-11 NOTE — Discharge Instructions (Signed)
10 Things You Can Do to Manage Your COVID-19 Symptoms at Home If you have possible or confirmed COVID-19: 1. Stay home from work and school. And stay away from other public places. If you must go out, avoid using any kind of public transportation, ridesharing, or taxis. 2. Monitor your symptoms carefully. If your symptoms get worse, call your healthcare provider immediately. 3. Get rest and stay hydrated. 4. If you have a medical appointment, call the healthcare provider ahead of time and tell them that you have or may have COVID-19. 5. For medical emergencies, call 911 and notify the dispatch personnel that you have or may have COVID-19. 6. Cover your cough and sneezes with a tissue or use the inside of your elbow. 7. Wash your hands often with soap and water for at least 20 seconds or clean your hands with an alcohol-based hand sanitizer that contains at least 60% alcohol. 8. As much as possible, stay in a specific room and away from other people in your home. Also, you should use a separate bathroom, if available. If you need to be around other people in or outside of the home, wear a mask. 9. Avoid sharing personal items with other people in your household, like dishes, towels, and bedding. 10. Clean all surfaces that are touched often, like counters, tabletops, and doorknobs. Use household cleaning sprays or wipes according to the label instructions. cdc.gov/coronavirus 07/11/2018 This information is not intended to replace advice given to you by your health care provider. Make sure you discuss any questions you have with your health care provider. Document Revised: 12/13/2018 Document Reviewed: 12/13/2018 Elsevier Patient Education  2020 Elsevier Inc. What types of side effects do monoclonal antibody drugs cause?  Common side effects  In general, the more common side effects caused by monoclonal antibody drugs include: . Allergic reactions, such as hives or itching . Flu-like signs and  symptoms, including chills, fatigue, fever, and muscle aches and pains . Nausea, vomiting . Diarrhea . Skin rashes . Low blood pressure   The CDC is recommending patients who receive monoclonal antibody treatments wait at least 90 days before being vaccinated.  Currently, there are no data on the safety and efficacy of mRNA COVID-19 vaccines in persons who received monoclonal antibodies or convalescent plasma as part of COVID-19 treatment. Based on the estimated half-life of such therapies as well as evidence suggesting that reinfection is uncommon in the 90 days after initial infection, vaccination should be deferred for at least 90 days, as a precautionary measure until additional information becomes available, to avoid interference of the antibody treatment with vaccine-induced immune responses. If you have any questions or concerns after the infusion please call the Advanced Practice Provider on call at [phone]. This number is ONLY intended for your use regarding questions or concerns about the infusion post-treatment side-effects.  Please do not provide this number to others for use. For return to work notes please contact your primary care provider.   If someone you know is interested in receiving treatment please have them call the COVID hotline at [phone].   

## 2019-12-11 NOTE — Telephone Encounter (Signed)
Called patient to pre-screen for monoclonal antibody infusion after receiving recent positive test. Patient qualifies based off off co-morbid condition and/or member of an at risk group. Patient reports onset 11/21  Patient Active Problem List   Diagnosis Date Noted  . Osteopenia 09/09/2019  . NASH (nonalcoholic steatohepatitis) 04/18/2019  . Essential hypertension 11/23/2016  . Estrogen deficiency 11/23/2016    Patient is interested in learning more about the infusion. RN forwarded information to APP's for additional screening/scheduling.   Danney Bungert Lorita Officer, RN

## 2019-12-11 NOTE — Progress Notes (Signed)
I connected by phone with Misty Rivas on [id number]/03/3005 at 1:47 PM to discuss the potential use of a new treatment for mild to moderate COVID-19 viral infection in non-hospitalized patients.  This patient is a 74 y.o. female that meets the FDA criteria for Emergency Use Authorization of COVID monoclonal antibody casirivimab/imdevimab, bamlanivimab/eteseviamb, or sotrovimab.  Has a (+) direct SARS-CoV-2 viral test result  Has mild or moderate COVID-19   Is NOT hospitalized due to COVID-19  Is within 10 days of symptom onset  Has at least one of the high risk factor(s) for progression to severe COVID-19 and/or hospitalization as defined in EUA.  Specific high risk criteria : Older age (>/= 73 yo) and Cardiovascular disease or hypertension   I have spoken and communicated the following to the patient or parent/caregiver regarding COVID monoclonal antibody treatment:  1. FDA has authorized the emergency use for the treatment of mild to moderate COVID-19 in adults and pediatric patients with positive results of direct SARS-CoV-2 viral testing who are 79 years of age and older weighing at least 40 kg, and who are at high risk for progressing to severe COVID-19 and/or hospitalization.  2. The significant known and potential risks and benefits of COVID monoclonal antibody, and the extent to which such potential risks and benefits are unknown.  3. Information on available alternative treatments and the risks and benefits of those alternatives, including clinical trials.  4. Patients treated with COVID monoclonal antibody should continue to self-isolate and use infection control measures (e.g., wear mask, isolate, social distance, avoid sharing personal items, clean and disinfect "high touch" surfaces, and frequent handwashing) according to CDC guidelines.   5. The patient or parent/caregiver has the option to accept or refuse COVID monoclonal antibody treatment.  After reviewing this information  with the patient, the patient has agreed to receive one of the available covid 19 monoclonal antibodies and will be provided an appropriate fact sheet prior to infusion.   Onset of symptoms: 12/03/19  Misty Kail, PA 12/11/2019 1:47 PM

## 2019-12-11 NOTE — Progress Notes (Signed)
Patient reviewed Fact Sheet for Patients, Parents, and Caregivers for Emergency Use Authorization (EUA) of Sotrovimab for the Treatment of Coronavirus. Patient also reviewed and is agreeable to the estimated cost of treatment. Patient is agreeable to proceed.   

## 2019-12-12 ENCOUNTER — Other Ambulatory Visit (HOSPITAL_COMMUNITY): Payer: Self-pay

## 2019-12-17 NOTE — Telephone Encounter (Signed)
Patient scheduled for 10:20 tomorrow per provider.

## 2019-12-18 ENCOUNTER — Ambulatory Visit (INDEPENDENT_AMBULATORY_CARE_PROVIDER_SITE_OTHER): Payer: Medicare Other | Admitting: Family Medicine

## 2019-12-18 ENCOUNTER — Encounter: Payer: Self-pay | Admitting: Family Medicine

## 2019-12-18 ENCOUNTER — Other Ambulatory Visit: Payer: Self-pay

## 2019-12-18 VITALS — BP 144/80 | HR 53 | Temp 97.6°F | Resp 18 | Ht 64.0 in | Wt 181.0 lb

## 2019-12-18 DIAGNOSIS — U071 COVID-19: Secondary | ICD-10-CM | POA: Diagnosis not present

## 2019-12-18 DIAGNOSIS — J40 Bronchitis, not specified as acute or chronic: Secondary | ICD-10-CM

## 2019-12-18 DIAGNOSIS — F4321 Adjustment disorder with depressed mood: Secondary | ICD-10-CM | POA: Diagnosis not present

## 2019-12-18 NOTE — Progress Notes (Signed)
[facility] [place], [place], [place] [address] [phone]  Date:  12/18/2019   Name:  Misty Rivas   DOB:  [birth year]   MRN:  [id number]  PCP:  Darreld Mclean, MD    Chief Complaint: Covid-19 (follow up) and Cough (coughing up green phlem )   History of Present Illness:  Misty Rivas is a 74 y.o. very pleasant female patient who presents with the following:  Visit today for follow-up on illness Patient with history of hypertension, [name], osteopenia She started to feel ill on November 23, and tested positive for COVID-19 on November 30 She did receive an antibody infusion on 12/1 and is fully vaccinated including booster dose  She continues to cough up some green phlegm No fever Mild ST Her ears do hurt She continues to have fatigue, still not feeling back to normal  I had called her in some doxycycline but she did not end up taking it- she does have it at home however   She also shares that her husband died on 11-16-19- they had been married for 51 years  He had dementia and died in hospice -she misses him a lot.  We discussed this today, she feels that her morning is appropriate and that she is not overly depressed at this time She is taking Lexapro 20 mg and will continue this  Patient Active Problem List   Diagnosis Date Noted  . Osteopenia 09/09/2019  . NASH (nonalcoholic steatohepatitis) 04/18/2019  . Essential hypertension 11/23/2016  . Estrogen deficiency 11/23/2016    Past Medical History:  Diagnosis Date  . Acid reflux   . Anxiety   . Arthritis   . Cataract    removed both eyes   . Degenerative joint disease of knee   . Depression   . Diverticulosis   . Fibromyalgia   . Gastric polyp   . GERD (gastroesophageal reflux disease)   . Hiatal hernia   . Hypertension    controlled     Past Surgical History:  Procedure Laterality Date  . CESAREAN SECTION     x2  .  CHOLECYSTECTOMY    . COLONOSCOPY    . POLYPECTOMY    . TONSILLECTOMY  1955  . 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 UPPER GASTROINTESTINAL ENDOSCOPY      Social History   Tobacco Use  . Smoking status: Never Smoker  . Smokeless tobacco: Never Used  Vaping Use  . Vaping Use: Never used  Substance Use Topics  . Alcohol use: Yes    Comment: occ  . Drug use: No    Family History  Problem Relation Age of Onset  . Mental illness Mother   . Heart disease Father   . Prostate cancer Father   . Breast cancer Sister   . Colon cancer Neg Hx   . Stomach cancer Neg Hx   . Rectal cancer Neg Hx   . Esophageal cancer Neg Hx   . Liver cancer Neg Hx   . Colon polyps Neg Hx     Allergies  Allergen Reactions  . Lisinopril Other (See Comments)    Per pt cannot tolerate medication makes her fatigued  . Sulfa Antibiotics Other (See Comments) and Rash    Other reaction(s): Other (See Comments) Unknown reaction per pt, hasnt taken in a long time. Unknown reaction per pt, hasnt taken in a long time.     Medication list has  been reviewed and updated.  Current Outpatient Medications on File Prior to Visit  Medication Sig Dispense Refill  . amLODipine (NORVASC) 5 MG tablet TAKE 1 TABLET (5 MG TOTAL) DAILY BY MOUTH. 90 tablet 1  . aspirin 81 MG EC tablet Take by mouth.    . Calcium Carb-Cholecalciferol (CALCIUM-VITAMIN D) 500-200 MG-UNIT tablet Take by mouth.    . escitalopram (LEXAPRO) 20 MG tablet Take 1 tablet (20 mg total) by mouth daily. 90 tablet 1  . fluticasone (FLONASE) 50 MCG/ACT nasal spray SPRAY 2 SPRAYS INTO EACH NOSTRIL EVERY DAY 16 mL 1  . levocetirizine (XYZAL) 5 MG tablet TAKE 1 TABLET BY MOUTH EVERY DAY IN THE EVENING 30 tablet 0  . methocarbamol (ROBAXIN) 500 MG tablet Take 1 tablet (500 mg total) by mouth every 8 (eight) hours as needed for muscle spasms. 30 tablet 0  . metoprolol tartrate (LOPRESSOR) 25 MG tablet TAKE 1 TABLET BY MOUTH TWICE A DAY 180 tablet 2  .  Multiple Vitamin (MULTI-VITAMINS) TABS Take by mouth.    . naproxen sodium (ALEVE) 220 MG tablet Take 220 mg by mouth as needed.      No current facility-administered medications on file prior to visit.    Review of Systems:  As per HPI- otherwise negative.   Physical Examination: Vitals:   12/18/19 1030  BP: (!) 144/80  Pulse: (!) 53  Resp: 18  Temp: 97.6 F (36.4 C)  SpO2: 97%   Vitals:   12/18/19 1030  Weight: 181 lb (82.1 kg)  Height: 5' 4"  (1.626 m)   Body mass index is 31.07 kg/m. Ideal Body Weight: Weight in (lb) to have BMI = 25: 145.3  GEN: no acute distress.  Overweight, appears well HEENT: Atraumatic, Normocephalic.  Bilateral TM wnl, oropharynx normal.  PEERL,EOMI.   Ears and Nose: No external deformity. CV: RRR, No M/G/R. No JVD. No thrill. No extra heart sounds. PULM: CTA B, no wheezes, crackles, rhonchi. No retractions. No resp. distress. No accessory muscle use. EXTR: No c/c/e PSYCH: Normally interactive. Conversant.   Pulse Readings from Last 3 Encounters:  12/18/19 (!) 53  12/11/19 (!) 52  09/09/19 64   BP Readings from Last 3 Encounters:  12/18/19 (!) 144/80  12/11/19 (!) 151/85  09/09/19 134/80     Assessment and Plan: COVID-19  Bronchitis  Mourning  Patient today for follow-up visit, she was diagnosed with COVID-19 and treated with antibody infusion about 1 week ago.  She still feels tired and fatigued, but is not getting worse.  I advised her that it would likely take her several weeks to regain her full strength.  I have asked her let me know if she is not gradually improving or if she is getting worse In the meantime we will cover her with doxycycline for potential bacterial bronchitis We discussed the recent onset of her spouse of 49 years.  Offered condolences and support.  I have asked her to let me know if she feels that her depression symptoms are worsening She has an appointment to see me for follow-up in February.  We can move  this up if necessary  This visit occurred during the SARS-CoV-2 public health emergency.  Safety protocols were in place, including screening questions prior to the visit, additional usage of staff PPE, and extensive cleaning of exam room while observing appropriate contact time as indicated for disinfecting solutions.    Signed Lamar Blinks, MD

## 2019-12-18 NOTE — Patient Instructions (Addendum)
It was good to see you again today- I am so sorry for the loss of your husband and your covid infection!   Most likely you are still getting over your covid 19 illness I would go ahead and take the doxycycline antibiotic- this will treat any secondary bacterial lung infection that may be starting  Please let me know how things are going over the next few months.  If your mood is not ok- developing worsening of depression please let me know

## 2019-12-26 ENCOUNTER — Encounter: Payer: Self-pay | Admitting: Family Medicine

## 2020-01-17 DIAGNOSIS — M25561 Pain in right knee: Secondary | ICD-10-CM | POA: Diagnosis not present

## 2020-01-17 DIAGNOSIS — M1711 Unilateral primary osteoarthritis, right knee: Secondary | ICD-10-CM | POA: Diagnosis not present

## 2020-01-28 ENCOUNTER — Encounter: Payer: Self-pay | Admitting: Family Medicine

## 2020-01-28 DIAGNOSIS — Z135 Encounter for screening for eye and ear disorders: Secondary | ICD-10-CM

## 2020-02-14 ENCOUNTER — Other Ambulatory Visit: Payer: Self-pay | Admitting: *Deleted

## 2020-02-14 DIAGNOSIS — I10 Essential (primary) hypertension: Secondary | ICD-10-CM

## 2020-02-14 MED ORDER — METOPROLOL TARTRATE 25 MG PO TABS: 25.0000 mg | ORAL_TABLET | Freq: Two times a day (BID) | ORAL | 2 refills | Status: DC

## 2020-03-07 NOTE — Patient Instructions (Addendum)
Good to see you again today- I will be in touch with your labs  Consider getting the Shingrix series at your pharmacy if not done already   We will try changing you over from metoprolol twice a day to once a day dosing- take at night, this may help with feeling sleepy Let me know if you need anything.  If doing well let's visit in 6 months

## 2020-03-07 NOTE — Progress Notes (Addendum)
[facility] [place], [place], [place] [address] [phone]  Date:  03/09/2020   Name:  Misty Rivas   DOB:  [birth year]   MRN:  [id number]  PCP:  Darreld Mclean, MD    Chief Complaint: Hypertension   History of Present Illness:  Misty Rivas is a 75 y.o. very pleasant female patient who presents with the following:  Here today for a follow-up visit - history of hypertension, [name], osteopenia Last seen by myself in December  She had covid 77 in November, treated with antibody infusion She feels like she has recovered well although she still does get tired in the afternoon Her husband of 38 years died in 11-15-22 of dementia under hospice care She was taking lexapro and doing ok at our last visit  She feels like emotionally she is doing ok though her empty house is still strange Her two sons are in [place], and she goes to a support group for women in a similar situation  She is planning to get a dog soon  mammo now due- she will have this done  Colon UTD dexa UTD shingrix- we discussed this today, she plans to do this at our pharmacy  Labs about one year ago   Amlodipine lexapro Lopressor flonase  Patient Active Problem List   Diagnosis Date Noted  . Osteopenia 09/09/2019  . NASH (nonalcoholic steatohepatitis) 04/18/2019  . Essential hypertension 11/23/2016  . Estrogen deficiency 11/23/2016    Past Medical History:  Diagnosis Date  . Acid reflux   . Anxiety   . Arthritis   . Cataract    removed both eyes   . Degenerative joint disease of knee   . Depression   . Diverticulosis   . Fibromyalgia   . Gastric polyp   . GERD (gastroesophageal reflux disease)   . Hiatal hernia   . Hypertension    controlled     Past Surgical History:  Procedure Laterality Date  . CESAREAN SECTION     x2  . CHOLECYSTECTOMY    . COLONOSCOPY    . POLYPECTOMY    . TONSILLECTOMY  1955  . TOTAL KNEE  ARTHROPLASTY Left 06/19/2017  . UPPER GASTROINTESTINAL ENDOSCOPY      Social History   Tobacco Use  . Smoking status: Never Smoker  . Smokeless tobacco: Never Used  Vaping Use  . Vaping Use: Never used  Substance Use Topics  . Alcohol use: Yes    Comment: occ  . Drug use: No    Family History  Problem Relation Age of Onset  . Mental illness Mother   . Heart disease Father   . Prostate cancer Father   . Breast cancer Sister   . Colon cancer Neg Hx   . Stomach cancer Neg Hx   . Rectal cancer Neg Hx   . Esophageal cancer Neg Hx   . Liver cancer Neg Hx   . Colon polyps Neg Hx     Allergies  Allergen Reactions  . Lisinopril Other (See Comments)    Per pt cannot tolerate medication makes her fatigued  . Sulfa Antibiotics Other (See Comments) and Rash    Other reaction(s): Other (See Comments) Unknown reaction per pt, hasnt taken in a long time. Unknown reaction per pt, hasnt taken in a long time.     Medication list has been reviewed and updated.  Current Outpatient Medications on File Prior to Visit  Medication  Sig Dispense Refill  . amLODipine (NORVASC) 5 MG tablet TAKE 1 TABLET (5 MG TOTAL) DAILY BY MOUTH. 90 tablet 1  . aspirin 81 MG EC tablet Take by mouth.    . Calcium Carb-Cholecalciferol (CALCIUM-VITAMIN D) 500-200 MG-UNIT tablet Take by mouth.    . escitalopram (LEXAPRO) 20 MG tablet Take 1 tablet (20 mg total) by mouth daily. 90 tablet 1  . fluticasone (FLONASE) 50 MCG/ACT nasal spray SPRAY 2 SPRAYS INTO EACH NOSTRIL EVERY DAY 16 mL 1  . levocetirizine (XYZAL) 5 MG tablet TAKE 1 TABLET BY MOUTH EVERY DAY IN THE EVENING 30 tablet 0  . methocarbamol (ROBAXIN) 500 MG tablet Take 1 tablet (500 mg total) by mouth every 8 (eight) hours as needed for muscle spasms. 30 tablet 0  . Multiple Vitamin (MULTI-VITAMINS) TABS Take by mouth.    . naproxen sodium (ALEVE) 220 MG tablet Take 220 mg by mouth as needed.      No current facility-administered medications on file  prior to visit.    Review of Systems:  As per HPI- otherwise negative.   Physical Examination: Vitals:   03/09/20 1129  BP: 126/64  Pulse: (!) 56  Resp: 17  Temp: (!) 97.1 F (36.2 C)  SpO2: 98%   Vitals:   03/09/20 1129  Weight: 180 lb (81.6 kg)  Height: 5' 4"  (1.626 m)   Body mass index is 30.9 kg/m. Ideal Body Weight: Weight in (lb) to have BMI = 25: 145.3  GEN: no acute distress.  Looks well, overweight HEENT: Atraumatic, Normocephalic.  Ears and Nose: No external deformity. CV: RRR, No M/G/R. No JVD. No thrill. No extra heart sounds. PULM: CTA B, no wheezes, crackles, rhonchi. No retractions. No resp. distress. No accessory muscle use. EXTR: No c/c/e PSYCH: Normally interactive. Conversant. Patient noticed seborrheic keratosis on her right shoulder.  I was able to peel this off her easily today  BP Readings from Last 3 Encounters:  03/09/20 126/64  12/18/19 (!) 144/80  12/11/19 (!) 151/85      Assessment and Plan: Essential hypertension - Plan: CBC, Comprehensive metabolic panel, metoprolol succinate (TOPROL-XL) 50 MG 24 hr tablet  NASH (nonalcoholic steatohepatitis) - Plan: Comprehensive metabolic panel, Lipid panel  Fatigue, unspecified type - Plan: VITAMIN D 25 Hydroxy (Vit-D Deficiency, Fractures), TSH  Vitamin D deficiency - Plan: VITAMIN D 25 Hydroxy (Vit-D Deficiency, Fractures)  Mourning  Following up today Patient is adjusting well to the loss of her husband.  She is taking her Lexapro and plans to get a dog.  She recently visited her sister and had a great time  Blood pressure under good control.  Patient notes some fatigue after taking her morning dose of Lopressor.  We will try changing her to Toprol-XL at bedtime Vitamin D and TSH pending  Will plan further follow- up pending labs.  This visit occurred during the SARS-CoV-2 public health emergency.  Safety protocols were in place, including screening questions prior to the visit,  additional usage of staff PPE, and extensive cleaning of exam room while observing appropriate contact time as indicated for disinfecting solutions.    Signed Lamar Blinks, MD  Received her labs as below 3/1- message to pt Mild elevation of LFTS is stable, known h/o fatty liver  Results for orders placed or performed in visit on 03/09/20  CBC  Result Value Ref Range   WBC 7.5 4.0 - 10.5 K/uL   RBC 5.06 3.87 - 5.11 Mil/uL   Platelets 298.0 150.0 - 400.0  K/uL   Hemoglobin 14.8 12.0 - 15.0 g/dL   HCT 43.6 36.0 - 46.0 %   MCV 86.3 78.0 - 100.0 fl   MCHC 33.9 30.0 - 36.0 g/dL   RDW 13.6 11.5 - 15.5 %  Comprehensive metabolic panel  Result Value Ref Range   Sodium 139 135 - 145 mEq/L   Potassium 4.1 3.5 - 5.1 mEq/L   Chloride 105 96 - 112 mEq/L   CO2 26 19 - 32 mEq/L   Glucose, Bld 88 70 - 99 mg/dL   BUN 12 6 - 23 mg/dL   Creatinine, Ser 0.76 0.40 - 1.20 mg/dL   Total Bilirubin 0.9 0.2 - 1.2 mg/dL   Alkaline Phosphatase 73 39 - 117 U/L   AST 49 (H) 0 - 37 U/L   ALT 48 (H) 0 - 35 U/L   Total Protein 6.9 6.0 - 8.3 g/dL   Albumin 4.4 3.5 - 5.2 g/dL   GFR 77.83 >60.00 mL/min   Calcium 10.5 8.4 - 10.5 mg/dL  Lipid panel  Result Value Ref Range   Cholesterol 230 (H) 0 - 200 mg/dL   Triglycerides 192.0 (H) 0.0 - 149.0 mg/dL   HDL 55.40 >39.00 mg/dL   VLDL 38.4 0.0 - 40.0 mg/dL   LDL Cholesterol 136 (H) 0 - 99 mg/dL   Total CHOL/HDL Ratio 4    NonHDL 174.53   VITAMIN D 25 Hydroxy (Vit-D Deficiency, Fractures)  Result Value Ref Range   VITD 22.92 (L) 30.00 - 100.00 ng/mL  TSH  Result Value Ref Range   TSH 2.66 0.35 - 4.50 uIU/mL

## 2020-03-09 ENCOUNTER — Ambulatory Visit (INDEPENDENT_AMBULATORY_CARE_PROVIDER_SITE_OTHER): Payer: Medicare Other | Admitting: Family Medicine

## 2020-03-09 ENCOUNTER — Other Ambulatory Visit: Payer: Self-pay

## 2020-03-09 VITALS — BP 126/64 | HR 56 | Temp 97.1°F | Resp 17 | Ht 64.0 in | Wt 180.0 lb

## 2020-03-09 DIAGNOSIS — E559 Vitamin D deficiency, unspecified: Secondary | ICD-10-CM | POA: Diagnosis not present

## 2020-03-09 DIAGNOSIS — F4321 Adjustment disorder with depressed mood: Secondary | ICD-10-CM

## 2020-03-09 DIAGNOSIS — K7581 Nonalcoholic steatohepatitis (NASH): Secondary | ICD-10-CM | POA: Diagnosis not present

## 2020-03-09 DIAGNOSIS — R5383 Other fatigue: Secondary | ICD-10-CM | POA: Diagnosis not present

## 2020-03-09 DIAGNOSIS — I10 Essential (primary) hypertension: Secondary | ICD-10-CM

## 2020-03-09 LAB — COMPREHENSIVE METABOLIC PANEL
ALT: 48 U/L — ABNORMAL HIGH (ref 0–35)
AST: 49 U/L — ABNORMAL HIGH (ref 0–37)
Albumin: 4.4 g/dL (ref 3.5–5.2)
Alkaline Phosphatase: 73 U/L (ref 39–117)
BUN: 12 mg/dL (ref 6–23)
CO2: 26 mEq/L (ref 19–32)
Calcium: 10.5 mg/dL (ref 8.4–10.5)
Chloride: 105 mEq/L (ref 96–112)
Creatinine, Ser: 0.76 mg/dL (ref 0.40–1.20)
GFR: 77.83 mL/min (ref >60.00)
Glucose, Bld: 88 mg/dL (ref 70–99)
Potassium: 4.1 mEq/L (ref 3.5–5.1)
Sodium: 139 mEq/L (ref 135–145)
Total Bilirubin: 0.9 mg/dL (ref 0.2–1.2)
Total Protein: 6.9 g/dL (ref 6.0–8.3)

## 2020-03-09 LAB — LIPID PANEL
Cholesterol: 230 mg/dL — ABNORMAL HIGH (ref 0–200)
HDL: 55.4 mg/dL (ref >39.00)
LDL Cholesterol: 136 mg/dL — ABNORMAL HIGH (ref 0–99)
NonHDL: 174.53
Total CHOL/HDL Ratio: 4
Triglycerides: 192 mg/dL — ABNORMAL HIGH (ref 0.0–149.0)
VLDL: 38.4 mg/dL (ref 0.0–40.0)

## 2020-03-09 LAB — CBC
HCT: 43.6 % (ref 36.0–46.0)
Hemoglobin: 14.8 g/dL (ref 12.0–15.0)
MCHC: 33.9 g/dL (ref 30.0–36.0)
MCV: 86.3 fl (ref 78.0–100.0)
Platelets: 298 10*3/uL (ref 150.0–400.0)
RBC: 5.06 Mil/uL (ref 3.87–5.11)
RDW: 13.6 % (ref 11.5–15.5)
WBC: 7.5 10*3/uL (ref 4.0–10.5)

## 2020-03-09 LAB — TSH: TSH: 2.66 u[IU]/mL (ref 0.35–4.50)

## 2020-03-09 LAB — VITAMIN D 25 HYDROXY (VIT D DEFICIENCY, FRACTURES): VITD: 22.92 ng/mL — ABNORMAL LOW (ref 30.00–100.00)

## 2020-03-09 MED ORDER — METOPROLOL SUCCINATE ER 50 MG PO TB24: 50.0000 mg | ORAL_TABLET | Freq: Every day | ORAL | 3 refills | Status: DC

## 2020-03-10 ENCOUNTER — Encounter: Payer: Self-pay | Admitting: Family Medicine

## 2020-03-10 MED ORDER — VITAMIN D3 1.25 MG (50000 UT) PO CAPS: ORAL_CAPSULE | ORAL | 0 refills | Status: AC

## 2020-03-10 NOTE — Addendum Note (Signed)
Addended by: Lamar Blinks C on: 03/10/2020 05:36 PM   Modules accepted: Orders

## 2020-04-28 DIAGNOSIS — H10413 Chronic giant papillary conjunctivitis, bilateral: Secondary | ICD-10-CM | POA: Diagnosis not present

## 2020-04-28 DIAGNOSIS — Z961 Presence of intraocular lens: Secondary | ICD-10-CM | POA: Diagnosis not present

## 2020-04-28 DIAGNOSIS — H524 Presbyopia: Secondary | ICD-10-CM | POA: Diagnosis not present

## 2020-04-28 DIAGNOSIS — H52203 Unspecified astigmatism, bilateral: Secondary | ICD-10-CM | POA: Diagnosis not present

## 2020-06-18 DIAGNOSIS — Z1231 Encounter for screening mammogram for malignant neoplasm of breast: Secondary | ICD-10-CM | POA: Diagnosis not present

## 2020-06-18 LAB — HM MAMMOGRAPHY

## 2020-06-19 ENCOUNTER — Encounter: Payer: Self-pay | Admitting: Family Medicine

## 2020-06-22 ENCOUNTER — Other Ambulatory Visit: Payer: Self-pay | Admitting: Family Medicine

## 2020-07-06 ENCOUNTER — Other Ambulatory Visit (HOSPITAL_BASED_OUTPATIENT_CLINIC_OR_DEPARTMENT_OTHER): Payer: Self-pay

## 2020-07-06 ENCOUNTER — Telehealth: Payer: Self-pay

## 2020-07-06 ENCOUNTER — Encounter: Payer: Self-pay | Admitting: Family Medicine

## 2020-07-06 DIAGNOSIS — I10 Essential (primary) hypertension: Secondary | ICD-10-CM

## 2020-07-06 MED ORDER — METOPROLOL TARTRATE 25 MG PO TABS
25.0000 mg | ORAL_TABLET | Freq: Two times a day (BID) | ORAL | 3 refills | Status: DC
  Filled 2020-07-06: qty 180, 90d supply, fill #0

## 2020-07-06 NOTE — Telephone Encounter (Signed)
Pt is request metoprolol succinate 50 mg to changed to the older medication because this current does is making her jittery. She has been on the once a day medication for the last 3 months.  She wants to go back on the medication BID. 25 mg BID vs taking the 50 mg daily. Pt would like to get the medication adjusted before she goes on vacation.   Has some ear ache off and on for the last couple of weeks, but not a concerned at this time. She will go on her vacation first and if it is still bothering her after the vacay she will call back about it. She will be leaving for [facility] on this upcoming Saturday.

## 2020-07-09 ENCOUNTER — Other Ambulatory Visit (HOSPITAL_BASED_OUTPATIENT_CLINIC_OR_DEPARTMENT_OTHER): Payer: Self-pay

## 2020-07-09 ENCOUNTER — Other Ambulatory Visit: Payer: Self-pay

## 2020-07-09 DIAGNOSIS — I10 Essential (primary) hypertension: Secondary | ICD-10-CM

## 2020-07-09 MED ORDER — METOPROLOL TARTRATE 25 MG PO TABS: 25.0000 mg | ORAL_TABLET | Freq: Two times a day (BID) | ORAL | 3 refills | Status: DC

## 2020-07-13 DIAGNOSIS — Z20822 Contact with and (suspected) exposure to covid-19: Secondary | ICD-10-CM | POA: Diagnosis not present

## 2020-08-04 NOTE — Progress Notes (Signed)
Subjective:   Misty Rivas is a 75 y.o. female who presents for Medicare Annual (Subsequent) preventive examination.  I connected with Chivon today by telephone and verified that I am speaking with the correct person using two identifiers. Location patient: home Location provider: work Persons participating in the virtual visit: patient, Marine scientist.    I discussed the limitations, risks, security and privacy concerns of performing an evaluation and management service by telephone and the availability of in person appointments. I also discussed with the patient that there may be a patient responsible charge related to this service. The patient expressed understanding and verbally consented to this telephonic visit.    Interactive audio and video telecommunications were attempted between this provider and patient, however failed, due to patient having technical difficulties OR patient did not have access to video capability.  We continued and completed visit with audio only.  Some vital signs may be absent or patient reported.   Time Spent with patient on telephone encounter: 20 minutes   Review of Systems     Cardiac Risk Factors include: advanced age (>61mn, >>42women);hypertension     Objective:    Today's Vitals   08/05/20 1143  Weight: 170 lb (77.1 kg)  Height: 5' 4"  (1.626 m)   Body mass index is 29.18 kg/m.  Advanced Directives 08/05/2020 07/16/2019 11/16/2017  Does Patient Have a Medical Advance Directive? Yes Yes Yes  Type of AParamedicof AKendallLiving will - HRozelLiving will  Copy of HHyannisin Chart? No - copy requested - No - copy requested    Current Medications (verified) Outpatient Encounter Medications as of 08/05/2020  Medication Sig   amLODipine (NORVASC) 5 MG tablet TAKE 1 TABLET (5 MG TOTAL) DAILY BY MOUTH.   aspirin 81 MG EC tablet Take by mouth.   Calcium Carb-Cholecalciferol  (CALCIUM-VITAMIN D) 500-200 MG-UNIT tablet Take by mouth.   Cholecalciferol (VITAMIN D3) 1.25 MG (50000 UT) CAPS Take 1 weekly for 12 weeks   escitalopram (LEXAPRO) 20 MG tablet TAKE ONE TABLET BY MOUTH ONE TIME DAILY   fluticasone (FLONASE) 50 MCG/ACT nasal spray SPRAY 2 SPRAYS INTO EACH NOSTRIL EVERY DAY   levocetirizine (XYZAL) 5 MG tablet TAKE 1 TABLET BY MOUTH EVERY DAY IN THE EVENING   methocarbamol (ROBAXIN) 500 MG tablet Take 1 tablet (500 mg total) by mouth every 8 (eight) hours as needed for muscle spasms.   metoprolol tartrate (LOPRESSOR) 25 MG tablet Take 1 tablet (25 mg total) by mouth 2 (two) times daily.   Multiple Vitamin (MULTI-VITAMINS) TABS Take by mouth.   naproxen sodium (ALEVE) 220 MG tablet Take 220 mg by mouth as needed.    No facility-administered encounter medications on file as of 08/05/2020.    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   UPPER GASTROINTESTINAL ENDOSCOPY     Family History  Problem Relation Age of Onset   Mental illness Mother    Heart disease Father    Prostate cancer Father    Breast cancer Sister    Colon cancer Neg Hx    Stomach cancer Neg Hx    Rectal cancer Neg Hx    Esophageal cancer Neg Hx    Liver cancer Neg Hx    Colon polyps Neg Hx    Social History   Socioeconomic History   Marital status: Widowed    Spouse name: Not on file   Number of children: 2   Years of education: Not on file    Highest education level: Not on file  Occupational History   Occupation: retired  Tobacco Use   Smoking status: Never   Smokeless tobacco: Never  Vaping Use   Vaping Use: Never used  Substance and Sexual Activity   Alcohol use: Yes    Comment: occ   Drug use: No   Sexual activity: Not on file  Other Topics Concern   Not on file  Social History Narrative   Not on file   Social Determinants of Health   Financial Resource Strain: Low Risk    Difficulty of Paying Living Expenses: Not hard at all  Food Insecurity: No Food Insecurity   Worried About Charity fundraiser in the Last Year: Never true   [place] in the Last Year: Never true  Transportation Needs: No Transportation Needs   Lack of Transportation (Medical): No   Lack of Transportation (Non-Medical): No  Physical Activity: Sufficiently Active   Days of Exercise per Week: 5 days   Minutes of Exercise per Session: 30 min  Stress: No Stress Concern Present   Feeling of Stress : Not at all  Social Connections: Moderately Integrated   Frequency of Communication with Friends and Family: More than three times a week   Frequency of Social Gatherings with Friends and Family: More than three times a week   Attends Religious Services: More than 4 times per year   Active Member of [employer] or Organizations: Yes   Attends Archivist Meetings: More than 4 times per year   Marital Status: Widowed    Tobacco Counseling Counseling given: Not Answered   Clinical Intake:  Pre-visit preparation completed: Yes  Pain : No/denies pain     Nutritional Status: BMI 25 -29 Overweight Nutritional Risks: None Diabetes: No  How often do you need to have someone help you when you read instructions, pamphlets, or other written materials from your doctor or pharmacy?: 1 - Never  Diabetic?No  Interpreter Needed?: No  Information entered by :: Caroleen Hamman LPN   Activities of Daily Living In your present state of  health, do you have any difficulty performing the following activities: 08/05/2020  Hearing? N  Vision? N  Difficulty concentrating or making decisions? N  Walking or climbing stairs? N  Dressing or bathing? N  Doing errands, shopping? N  Preparing Food and eating ? N  Using the Toilet? N  In the past six months, have you accidently leaked urine? N  Do you have problems with loss of bowel control? N  Managing your Medications? N  Managing your Finances? N  Housekeeping or managing your Housekeeping? N  Some recent data might be hidden    Patient Care Team: Copland, Gay Filler, MD as PCP - General (  Family Medicine)  Indicate any recent Medical Services you may have received from other than [facility] providers in the past year (date may be approximate).     Assessment:   This is a routine wellness examination for Tashay.  Hearing/Vision screen Hearing Screening - Comments:: No issues Vision Screening - Comments:: Wears glasses Last eye exam-06/2020-Dr. Delman Cheadle  Dietary issues and exercise activities discussed: Current Exercise Habits: Home exercise routine, Type of exercise: walking, Time (Minutes): 30, Frequency (Times/Week): 5, Weekly Exercise (Minutes/Week): 150, Intensity: Mild, Exercise limited by: None identified   Goals Addressed             This Visit's Progress    Patient Stated       Continue eating healthy & exercising       Depression Screen PHQ 2/9 Scores 08/05/2020 11/16/2017 11/23/2016  PHQ - 2 Score 0 1 0    Fall Risk Fall Risk  08/05/2020 07/18/2019 11/16/2017 11/23/2016  Falls in the past year? 0 0 1 No  Number falls in past yr: 0 - - -  Injury with Fall? 0 - - -  Follow up Falls prevention discussed - - -    FALL [place]:  Any stairs in or around the home? Yes  If so, are there any without handrails? No  Home free of loose throw rugs in walkways, pet beds, electrical cords, etc? Yes  Adequate lighting in your home to  reduce risk of falls? Yes   ASSISTIVE DEVICES UTILIZED TO PREVENT FALLS:  Life alert? No  Use of a cane, walker or w/c? No  Grab bars in the bathroom? Yes  Shower chair or bench in shower? No  Elevated toilet seat or a handicapped toilet? No   TIMED UP AND GO:  Was the test performed? No . Phone visit   Cognitive Function:Normal cognitive status assessed by this Nurse Health Advisor. No abnormalities found.          Immunizations Immunization History  Administered Date(s) Administered   Fluad Quad(high Dose 65+) 10/31/2018   Influenza-Unspecified 12/06/2016, 09/11/2019   PFIZER(Purple Top)SARS-COV-2 Vaccination 01/31/2019, 02/21/2019, 12/02/2019   Pneumococcal Conjugate-13 11/27/2013   Pneumococcal Polysaccharide-23 03/31/2010, 11/23/2017   Tdap 12/23/2010   Zoster, Live 04/20/2009    TDAP status: Up to date  Flu Vaccine status: Up to date  Pneumococcal vaccine status: Up to date  Covid-19 vaccine status: Information provided on how to obtain vaccines.  Booster due but patient is concerned because she had a local reaction to the last shot. She will discuss with PCP at next visit.  Qualifies for Shingles Vaccine? Yes   Zostavax completed Yes   Shingrix Completed?: No.    Education has been provided regarding the importance of this vaccine. Patient has been advised to call insurance company to determine out of pocket expense if they have not yet received this vaccine. Advised may also receive vaccine at local pharmacy or Health Dept. Verbalized acceptance and understanding.  Screening Tests Health Maintenance  Topic Date Due   Zoster Vaccines- Shingrix (1 of 2) Never done   COVID-19 Vaccine (4 - Booster for Pfizer series) 03/31/2020   INFLUENZA VACCINE  08/10/2020   TETANUS/TDAP  12/22/2020   MAMMOGRAM  06/19/2022   COLONOSCOPY (Pts 45-75yr Insurance coverage will need to be confirmed)  11/06/2023   DEXA SCAN  Completed   Hepatitis C Screening  Completed   PNA  vac Low Risk Adult  Completed   HPV VACCINES  Aged Out  Health Maintenance  Health Maintenance Due  Topic Date Due   Zoster Vaccines- Shingrix (1 of 2) Never done   COVID-19 Vaccine (4 - Booster for Pfizer series) 03/31/2020    Colorectal cancer screening: Type of screening: Colonoscopy. Completed 11/05/2013. Repeat every 10 years  Mammogram status: Completed Bilateral 06/18/2020. Repeat every year  Bone Density status: Completed 09/09/2019. Results reflect: Bone density results: OSTEOPENIA. Repeat every 2 years.  Lung Cancer Screening: (Low Dose CT Chest recommended if Age 13-80 years, 30 pack-year currently smoking OR have quit w/in 15years.) does not qualify.     Additional Screening:  Hepatitis C Screening:  Completed 12/24/2016  Vision Screening: Recommended annual ophthalmology exams for early detection of glaucoma and other disorders of the eye. Is the patient up to date with their annual eye exam?  Yes  Who is the provider or what is the name of the office in which the patient attends annual eye exams? Dr. Delman Cheadle   Dental Screening: Recommended annual dental exams for proper oral hygiene  Community Resource Referral / Chronic Care Management: CRR required this visit?  No   CCM required this visit?  No      Plan:     I have personally reviewed and noted the following in the patient's chart:   Medical and social history Use of alcohol, tobacco or illicit drugs  Current medications and supplements including opioid prescriptions.  Functional ability and status Nutritional status Physical activity Advanced directives List of other physicians Hospitalizations, surgeries, and ER visits in previous 12 months Vitals Screenings to include cognitive, depression, and falls Referrals and appointments  In addition, I have reviewed and discussed with patient certain preventive protocols, quality metrics, and best practice recommendations. A written personalized care  plan for preventive services as well as general preventive health recommendations were provided to patient.   Due to this being a telephonic visit, the after visit summary with patients personalized plan was offered to patient via mail or my-chart. Patient declined at this time.   Marta Antu, LPN   [id number]  Nurse Health Advisor  Nurse Notes: None

## 2020-08-05 ENCOUNTER — Ambulatory Visit (INDEPENDENT_AMBULATORY_CARE_PROVIDER_SITE_OTHER): Payer: Medicare Other

## 2020-08-05 VITALS — Ht 64.0 in | Wt 170.0 lb

## 2020-08-05 DIAGNOSIS — Z0000 Encounter for general adult medical examination without abnormal findings: Secondary | ICD-10-CM

## 2020-08-05 NOTE — Patient Instructions (Signed)
Misty Rivas , Thank you for taking time to complete your Medicare Wellness Visit. I appreciate your ongoing commitment to your health goals. Please review the following plan we discussed and let me know if I can assist you in the future.   Screening recommendations/referrals: Colonoscopy: Completed 11/05/2013-Due 11/06/2023 Mammogram: Completed 06/18/2020-Due 06/18/2021 Bone Density: Completed 09/09/2019-Due 09/08/2021 Recommended yearly ophthalmology/optometry visit for glaucoma screening and checkup Recommended yearly dental visit for hygiene and checkup  Vaccinations: Influenza vaccine: Up to date Pneumococcal vaccine: Up to date Tdap vaccine: Up to date-Due 12/22/2020 Shingles vaccine: Discuss with pharmacy   Covid-19:Booster due  Advanced directives: Please bring a copy for your chart  Conditions/risks identified: See problem list  Next appointment: Follow up in one year for your annual wellness visit    Preventive Care 74 Years and Older, Female Preventive care refers to lifestyle choices and visits with your health care provider that can promote health and wellness. What does preventive care include? A yearly physical exam. This is also called an annual well check. Dental exams once or twice a year. Routine eye exams. Ask your health care provider how often you should have your eyes checked. Personal lifestyle choices, including: Daily care of your teeth and gums. Regular physical activity. Eating a healthy diet. Avoiding tobacco and drug use. Limiting alcohol use. Practicing safe sex. Taking low-dose aspirin every day. Taking vitamin and mineral supplements as recommended by your health care provider. What happens during an annual well check? The services and screenings done by your health care provider during your annual well check will depend on your age, overall health, lifestyle risk factors, and family history of disease. Counseling  Your health care provider may ask  you questions about your: Alcohol use. Tobacco use. Drug use. Emotional well-being. Home and relationship well-being. Sexual activity. Eating habits. History of falls. Memory and ability to understand (cognition). Work and work Statistician. Reproductive health. Screening  You may have the following tests or measurements: Height, weight, and BMI. Blood pressure. Lipid and cholesterol levels. These may be checked every 5 years, or more frequently if you are over 74 years old. Skin check. Lung cancer screening. You may have this screening every year starting at age 74 if you have a 30-pack-year history of smoking and currently smoke or have quit within the past 15 years. Fecal occult blood test (FOBT) of the stool. You may have this test every year starting at age 74. Flexible sigmoidoscopy or colonoscopy. You may have a sigmoidoscopy every 5 years or a colonoscopy every 10 years starting at age 74. Hepatitis C blood test. Hepatitis B blood test. Sexually transmitted disease (STD) testing. Diabetes screening. This is done by checking your blood sugar (glucose) after you have not eaten for a while (fasting). You may have this done every 1-3 years. Bone density scan. This is done to screen for osteoporosis. You may have this done starting at age 74. Mammogram. This may be done every 1-2 years. Talk to your health care provider about how often you should have regular mammograms. Talk with your health care provider about your test results, treatment options, and if necessary, the need for more tests. Vaccines  Your health care provider may recommend certain vaccines, such as: Influenza vaccine. This is recommended every year. Tetanus, diphtheria, and acellular pertussis (Tdap, Td) vaccine. You may need a Td booster every 10 years. Zoster vaccine. You may need this after age 74. Pneumococcal 13-valent conjugate (PCV13) vaccine. One dose is recommended after age 74.  Pneumococcal  polysaccharide (PPSV23) vaccine. One dose is recommended after age 76. Talk to your health care provider about which screenings and vaccines you need and how often you need them. This information is not intended to replace advice given to you by your health care provider. Make sure you discuss any questions you have with your health care provider. Document Released: 01/23/2015 Document Revised: 09/16/2015 Document Reviewed: 10/28/2014 Elsevier Interactive Patient Education  2017 [place] Prevention in the Home Falls can cause injuries. They can happen to people of all ages. There are many things you can do to make your home safe and to help prevent falls. What can I do on the outside of my home? Regularly fix the edges of walkways and driveways and fix any cracks. Remove anything that might make you trip as you walk through a door, such as a raised step or threshold. Trim any bushes or trees on the path to your home. Use bright outdoor lighting. Clear any walking paths of anything that might make someone trip, such as rocks or tools. Regularly check to see if handrails are loose or broken. Make sure that both sides of any steps have handrails. Any raised decks and porches should have guardrails on the edges. Have any leaves, snow, or ice cleared regularly. Use sand or salt on walking paths during winter. Clean up any spills in your garage right away. This includes oil or grease spills. What can I do in the bathroom? Use night lights. Install grab bars by the toilet and in the tub and shower. Do not use towel bars as grab bars. Use non-skid mats or decals in the tub or shower. If you need to sit down in the shower, use a plastic, non-slip stool. Keep the floor dry. Clean up any water that spills on the floor as soon as it happens. Remove soap buildup in the tub or shower regularly. Attach bath mats securely with double-sided non-slip rug tape. Do not have throw rugs and other  things on the floor that can make you trip. What can I do in the bedroom? Use night lights. Make sure that you have a light by your bed that is easy to reach. Do not use any sheets or blankets that are too big for your bed. They should not hang down onto the floor. Have a firm chair that has side arms. You can use this for support while you get dressed. Do not have throw rugs and other things on the floor that can make you trip. What can I do in the kitchen? Clean up any spills right away. Avoid walking on wet floors. Keep items that you use a lot in easy-to-reach places. If you need to reach something above you, use a strong step stool that has a grab bar. Keep electrical cords out of the way. Do not use floor polish or wax that makes floors slippery. If you must use wax, use non-skid floor wax. Do not have throw rugs and other things on the floor that can make you trip. What can I do with my stairs? Do not leave any items on the stairs. Make sure that there are handrails on both sides of the stairs and use them. Fix handrails that are broken or loose. Make sure that handrails are as long as the stairways. Check any carpeting to make sure that it is firmly attached to the stairs. Fix any carpet that is loose or worn. Avoid having throw rugs at the  top or bottom of the stairs. If you do have throw rugs, attach them to the floor with carpet tape. Make sure that you have a light switch at the top of the stairs and the bottom of the stairs. If you do not have them, ask someone to add them for you. What else can I do to help prevent falls? Wear shoes that: Do not have high heels. Have rubber bottoms. Are comfortable and fit you well. Are closed at the toe. Do not wear sandals. If you use a stepladder: Make sure that it is fully opened. Do not climb a closed stepladder. Make sure that both sides of the stepladder are locked into place. Ask someone to hold it for you, if possible. Clearly  mark and make sure that you can see: Any grab bars or handrails. First and last steps. Where the edge of each step is. Use tools that help you move around (mobility aids) if they are needed. These include: Canes. Walkers. Scooters. Crutches. Turn on the lights when you go into a dark area. Replace any light bulbs as soon as they burn out. Set up your furniture so you have a clear path. Avoid moving your furniture around. If any of your floors are uneven, fix them. If there are any pets around you, be aware of where they are. Review your medicines with your doctor. Some medicines can make you feel dizzy. This can increase your chance of falling. Ask your doctor what other things that you can do to help prevent falls. This information is not intended to replace advice given to you by your health care provider. Make sure you discuss any questions you have with your health care provider. Document Released: 10/23/2008 Document Revised: 06/04/2015 Document Reviewed: 01/31/2014 Elsevier Interactive Patient Education  2017 [employer].

## 2020-08-11 DIAGNOSIS — E559 Vitamin D deficiency, unspecified: Secondary | ICD-10-CM | POA: Insufficient documentation

## 2020-08-11 NOTE — Patient Instructions (Addendum)
It was very nice to see you again today!  You might try some voltaren/ dicolfenac gel on your sore finger.  I will also get you set up to see one of the hand surgeons Please look into getting the shingrix vaccine at your pharmacy We will check your Vit D and liver function today  Perhaps try pilates to strengthen your shoulders and pull your shoulder blades back

## 2020-08-11 NOTE — Progress Notes (Signed)
[place] at [facility] [address] [place], [place] [address] [phone]  Date:  08/12/2020   Name:  Misty Rivas   DOB:  [birth year]   MRN:  [id number]  PCP:  Darreld Mclean, MD    Chief Complaint: Hypertension and Arthritis   History of Present Illness:  Misty Rivas is a 75 y.o. very pleasant female patient who presents with the following:  Patient today for 24-monthfollow-up visit-history of hypertension, NKarlene Lineman osteopenia, vitamin D deficiency Most recently seen by myself in February  She had COVID in N12-01-2021Her husband died last year-he had dementia and was under hospice care.  At her last visit she was taking Lexapro and felt like she was doing okay She notes that overall she continues to do ok  [place] They gave her a puppy in May of this year  This is great but a lot of work   Shingles vaccine- she plans to do this  COVID-19 booster- not done yet, she got a reaction to her last shot. She might get the new booster when available  Bone density up-to-date Mammogram up-to-date Colonoscopy in 2015 Labs were done in February, mild elevation of LFTs which is stable Vitamin D was deficient-we treated her with a course of high-dose vitamin D Can recheck today if she would like  Metoprolol Amlodipine Lexapro  Someone was rubbing medication on her back recently and commented that there seemed to be a bump on her back Her right pinky finger is quite arthritis  She is right handed  Patient Active Problem List   Diagnosis Date Noted   Vitamin D deficiency 08/11/2020   Osteopenia 09/09/2019   NASH (nonalcoholic steatohepatitis) 04/18/2019   Essential hypertension 11/23/2016   Estrogen deficiency 11/23/2016    Past Medical History:  Diagnosis Date   Acid reflux    Anxiety    Arthritis    Cataract    removed both eyes    Degenerative joint disease of knee     Depression    Diverticulosis    Fibromyalgia    Gastric polyp    GERD (gastroesophageal reflux disease)    Hiatal hernia    Hypertension    controlled     Past Surgical History:  Procedure Laterality Date   CESAREAN SECTION     x2   CHOLECYSTECTOMY     COLONOSCOPY     POLYPECTOMY     TONSILLECTOMY  1955   TOTAL KNEE ARTHROPLASTY Left 06/19/2017   UPPER GASTROINTESTINAL ENDOSCOPY      Social History   Tobacco Use   Smoking status: Never   Smokeless tobacco: Never  Vaping Use   Vaping Use: Never used  Substance Use Topics   Alcohol use: Yes    Comment: occ   Drug use: No    Family History  Problem Relation Age of Onset   Mental illness Mother    Heart disease Father    Prostate cancer Father    Breast cancer Sister    Colon cancer Neg Hx    Stomach cancer Neg Hx    Rectal cancer Neg Hx    Esophageal cancer Neg Hx    Liver cancer Neg Hx    Colon polyps Neg Hx     Allergies  Allergen Reactions   Lisinopril Other (See Comments)    Per pt cannot tolerate medication makes her fatigued  Sulfa Antibiotics Other (See Comments) and Rash    Other reaction(s): Other (See Comments) Unknown reaction per pt, hasnt taken in a long time. Unknown reaction per pt, hasnt taken in a long time.     Medication list has been reviewed and updated.  Current Outpatient Medications on File Prior to Visit  Medication Sig Dispense Refill   amLODipine (NORVASC) 5 MG tablet TAKE 1 TABLET (5 MG TOTAL) DAILY BY MOUTH. 90 tablet 1   aspirin 81 MG EC tablet Take by mouth.     Calcium Carb-Cholecalciferol (CALCIUM-VITAMIN D) 500-200 MG-UNIT tablet Take by mouth.     Cholecalciferol (VITAMIN D3) 1.25 MG (50000 UT) CAPS Take 1 weekly for 12 weeks 12 capsule 0   escitalopram (LEXAPRO) 20 MG tablet TAKE ONE TABLET BY MOUTH ONE TIME DAILY 90 tablet 0   levocetirizine (XYZAL) 5 MG tablet TAKE 1 TABLET BY MOUTH EVERY DAY IN THE EVENING 30 tablet 0   metoprolol tartrate (LOPRESSOR) 25 MG  tablet Take 1 tablet (25 mg total) by mouth 2 (two) times daily. 180 tablet 3   Multiple Vitamin (MULTI-VITAMINS) TABS Take by mouth.     naproxen sodium (ALEVE) 220 MG tablet Take 220 mg by mouth as needed.      No current facility-administered medications on file prior to visit.    Review of Systems:  As per HPI- otherwise negative.   Physical Examination: Vitals:   08/12/20 1303  BP: 132/68  Pulse: (!) 54  Resp: 18  SpO2: 97%   Vitals:   08/12/20 1303  Weight: 170 lb (77.1 kg)  Height: 5' 4"  (1.626 m)   Body mass index is 29.18 kg/m. Ideal Body Weight: Weight in (lb) to have BMI = 25: 145.3  GEN: no acute distress.  Overweight, looks well  HEENT: Atraumatic, Normocephalic.  Ears and Nose: No external deformity. CV: RRR, No M/G/R. No JVD. No thrill. No extra heart sounds. PULM: CTA B, no wheezes, crackles, rhonchi. No retractions. No resp. distress. No accessory muscle use. ABD: S, NT, ND, +BS. No rebound. No HSM. EXTR: No c/c/e PSYCH: Normally interactive. Conversant.  Severe OA changes in the IP joints of the right pinky finger  The changes patient has noted on her right upper back is a more prominent scapula on the right, likely due to weak scapular stabilizers.  I suggested that she try an exercise routine such as Pilates for strengthening  Assessment and Plan: Osteoarthritis of finger of right hand - Plan: Ambulatory referral to Hand Surgery  Vitamin D deficiency - Plan: VITAMIN D 25 Hydroxy (Vit-D Deficiency, Fractures)  NASH (nonalcoholic steatohepatitis) - Plan: Hepatic function panel  Essential hypertension  She has painful arthritis in her right pinky finger which limits activities.  I suggested diclofenac gel, will refer to orthopedics for possible injection  Follow-up on vitamin D and liver function today  Blood pressure under good control This visit occurred during the SARS-CoV-2 public health emergency.  Safety protocols were in place, including  screening questions prior to the visit, additional usage of staff PPE, and extensive cleaning of exam room while observing appropriate contact time as indicated for disinfecting solutions.   Signed Lamar Blinks, MD

## 2020-08-12 ENCOUNTER — Ambulatory Visit (INDEPENDENT_AMBULATORY_CARE_PROVIDER_SITE_OTHER): Payer: Medicare Other | Admitting: Family Medicine

## 2020-08-12 ENCOUNTER — Other Ambulatory Visit: Payer: Self-pay

## 2020-08-12 VITALS — BP 132/68 | HR 54 | Resp 18 | Ht 64.0 in | Wt 170.0 lb

## 2020-08-12 DIAGNOSIS — K7581 Nonalcoholic steatohepatitis (NASH): Secondary | ICD-10-CM

## 2020-08-12 DIAGNOSIS — E559 Vitamin D deficiency, unspecified: Secondary | ICD-10-CM | POA: Diagnosis not present

## 2020-08-12 DIAGNOSIS — M19041 Primary osteoarthritis, right hand: Secondary | ICD-10-CM | POA: Diagnosis not present

## 2020-08-12 DIAGNOSIS — I10 Essential (primary) hypertension: Secondary | ICD-10-CM | POA: Diagnosis not present

## 2020-08-13 ENCOUNTER — Encounter: Payer: Self-pay | Admitting: Family Medicine

## 2020-08-13 LAB — HEPATIC FUNCTION PANEL
ALT: 37 U/L — ABNORMAL HIGH (ref 0–35)
AST: 40 U/L — ABNORMAL HIGH (ref 0–37)
Albumin: 4.4 g/dL (ref 3.5–5.2)
Alkaline Phosphatase: 77 U/L (ref 39–117)
Bilirubin, Direct: 0.1 mg/dL (ref 0.0–0.3)
Total Bilirubin: 0.8 mg/dL (ref 0.2–1.2)
Total Protein: 6.9 g/dL (ref 6.0–8.3)

## 2020-08-13 LAB — VITAMIN D 25 HYDROXY (VIT D DEFICIENCY, FRACTURES): VITD: 50.47 ng/mL (ref 30.00–100.00)

## 2020-11-04 ENCOUNTER — Other Ambulatory Visit: Payer: Self-pay | Admitting: Family Medicine

## 2020-11-18 DIAGNOSIS — Z20828 Contact with and (suspected) exposure to other viral communicable diseases: Secondary | ICD-10-CM | POA: Diagnosis not present

## 2020-11-25 ENCOUNTER — Other Ambulatory Visit (HOSPITAL_BASED_OUTPATIENT_CLINIC_OR_DEPARTMENT_OTHER): Payer: Self-pay

## 2020-11-25 DIAGNOSIS — Z23 Encounter for immunization: Secondary | ICD-10-CM | POA: Diagnosis not present

## 2020-11-25 MED ORDER — INFLUENZA VAC A&B SA ADJ QUAD 0.5 ML IM PRSY
PREFILLED_SYRINGE | INTRAMUSCULAR | 0 refills | Status: DC
  Filled 2020-11-25: qty 0.5, 1d supply, fill #0

## 2020-12-10 DIAGNOSIS — H43813 Vitreous degeneration, bilateral: Secondary | ICD-10-CM | POA: Diagnosis not present

## 2020-12-10 DIAGNOSIS — Z961 Presence of intraocular lens: Secondary | ICD-10-CM | POA: Diagnosis not present

## 2020-12-18 DIAGNOSIS — Z6829 Body mass index (BMI) 29.0-29.9, adult: Secondary | ICD-10-CM | POA: Diagnosis not present

## 2020-12-18 DIAGNOSIS — Z20822 Contact with and (suspected) exposure to covid-19: Secondary | ICD-10-CM | POA: Diagnosis not present

## 2020-12-18 DIAGNOSIS — I10 Essential (primary) hypertension: Secondary | ICD-10-CM | POA: Diagnosis not present

## 2020-12-18 DIAGNOSIS — J069 Acute upper respiratory infection, unspecified: Secondary | ICD-10-CM | POA: Diagnosis not present

## 2020-12-18 DIAGNOSIS — E663 Overweight: Secondary | ICD-10-CM | POA: Diagnosis not present

## 2021-02-02 DIAGNOSIS — Z20822 Contact with and (suspected) exposure to covid-19: Secondary | ICD-10-CM | POA: Diagnosis not present

## 2021-02-15 DIAGNOSIS — Z20822 Contact with and (suspected) exposure to covid-19: Secondary | ICD-10-CM | POA: Diagnosis not present

## 2021-04-09 ENCOUNTER — Telehealth: Payer: Self-pay | Admitting: Family Medicine

## 2021-04-09 NOTE — Telephone Encounter (Signed)
Left vm to return call.  ? ?

## 2021-04-09 NOTE — Telephone Encounter (Signed)
Rescheduled for 04/14/21 at 10 am ?

## 2021-04-09 NOTE — Telephone Encounter (Signed)
Pt stated she is having left side pain pain with no other sxs. She wanted to wait until dr.Copland's next available, which is 4/17. She also did not want to be triaged, please advise.  ?

## 2021-04-13 IMAGING — DX DG CERVICAL SPINE COMPLETE 4+V
6 series · 6 of 6 positions shown · non-contrast
Comparison: None

CLINICAL DATA: Pain in LEFT upper paracervical muscles, no known
injury with stiffness for 1 month.

EXAM:
CERVICAL SPINE - COMPLETE 4+ VIEW

[c-spine lat]
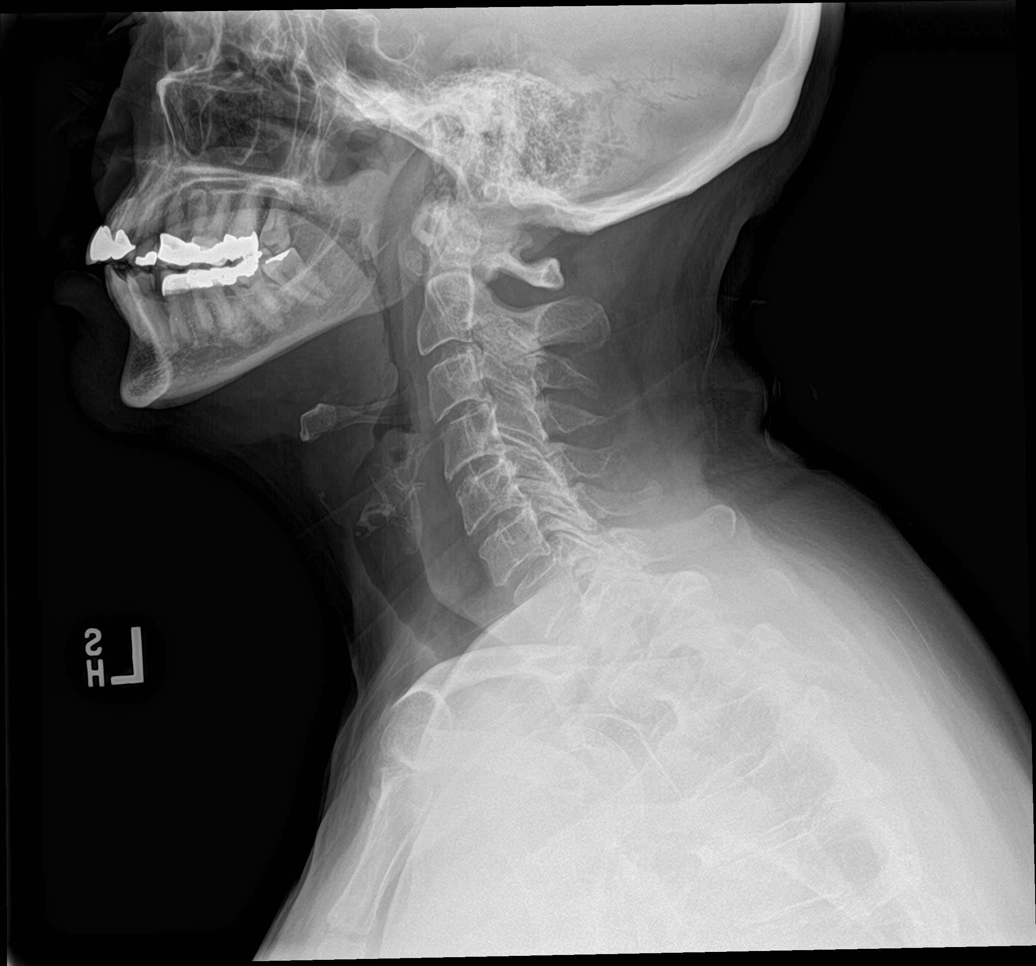

[c-spine obl (1 of 2)]
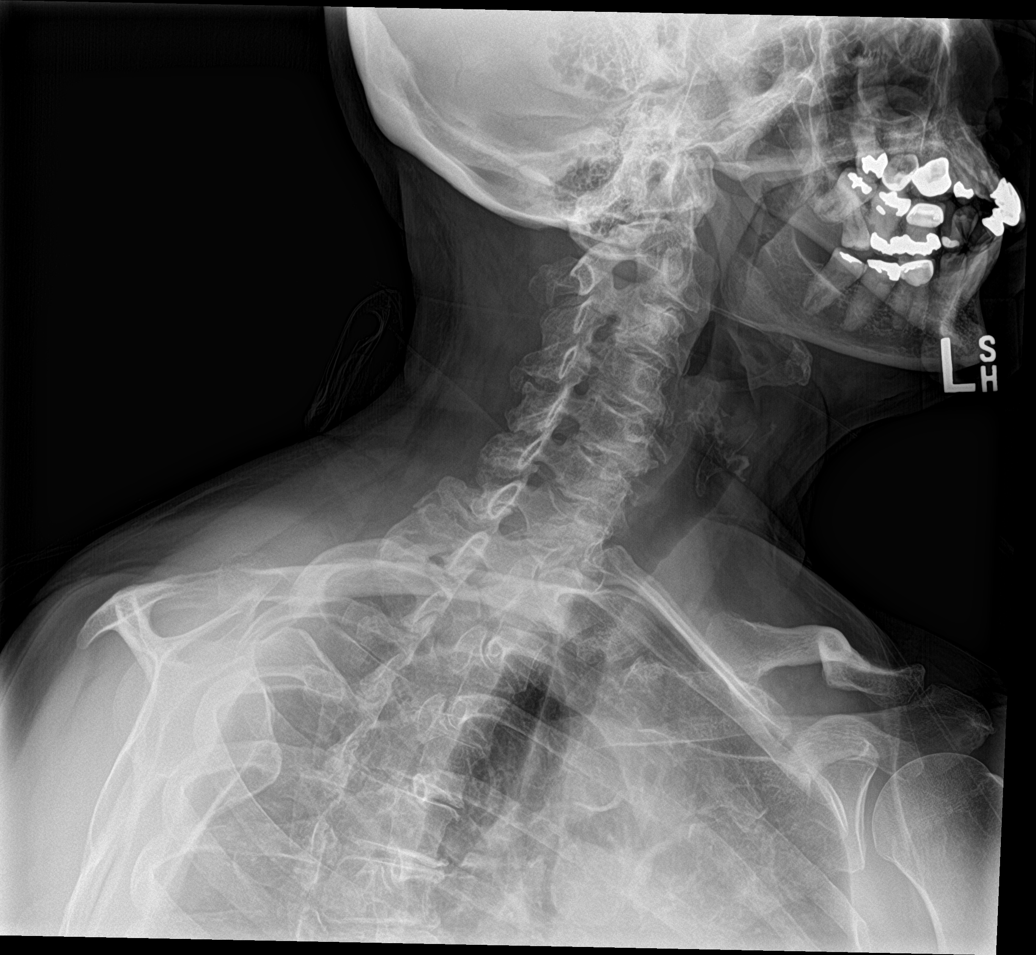

[c-spine obl (2 of 2)]
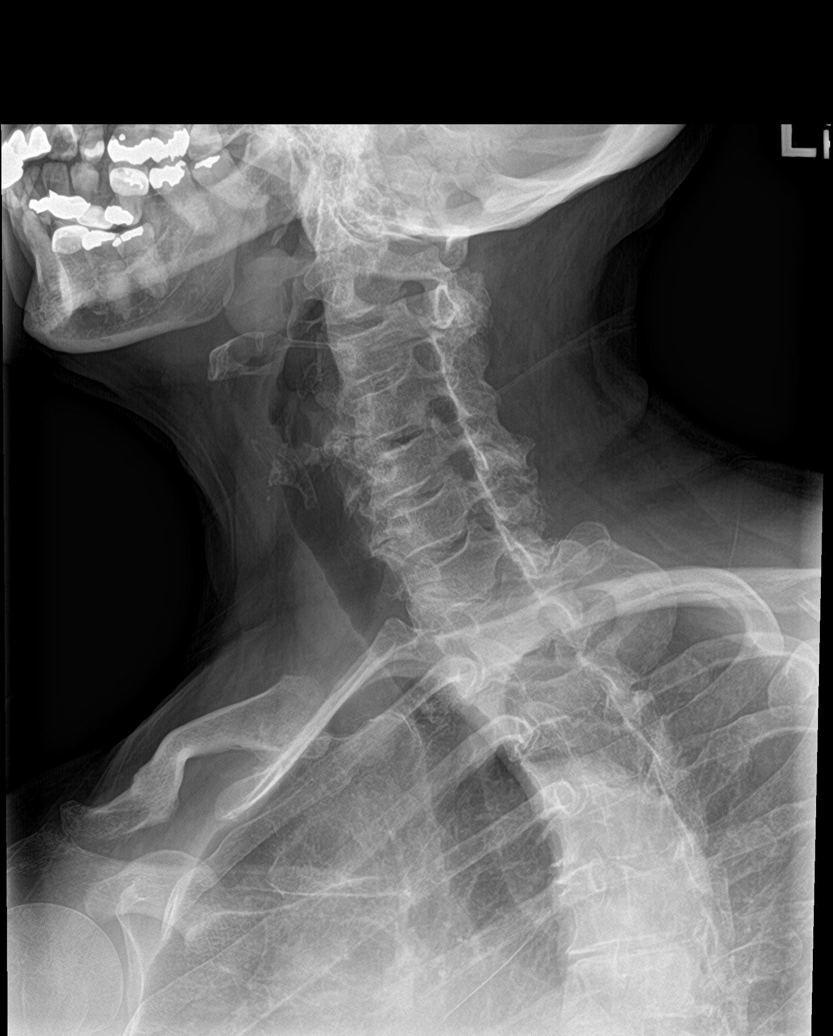

[c-spine ap]
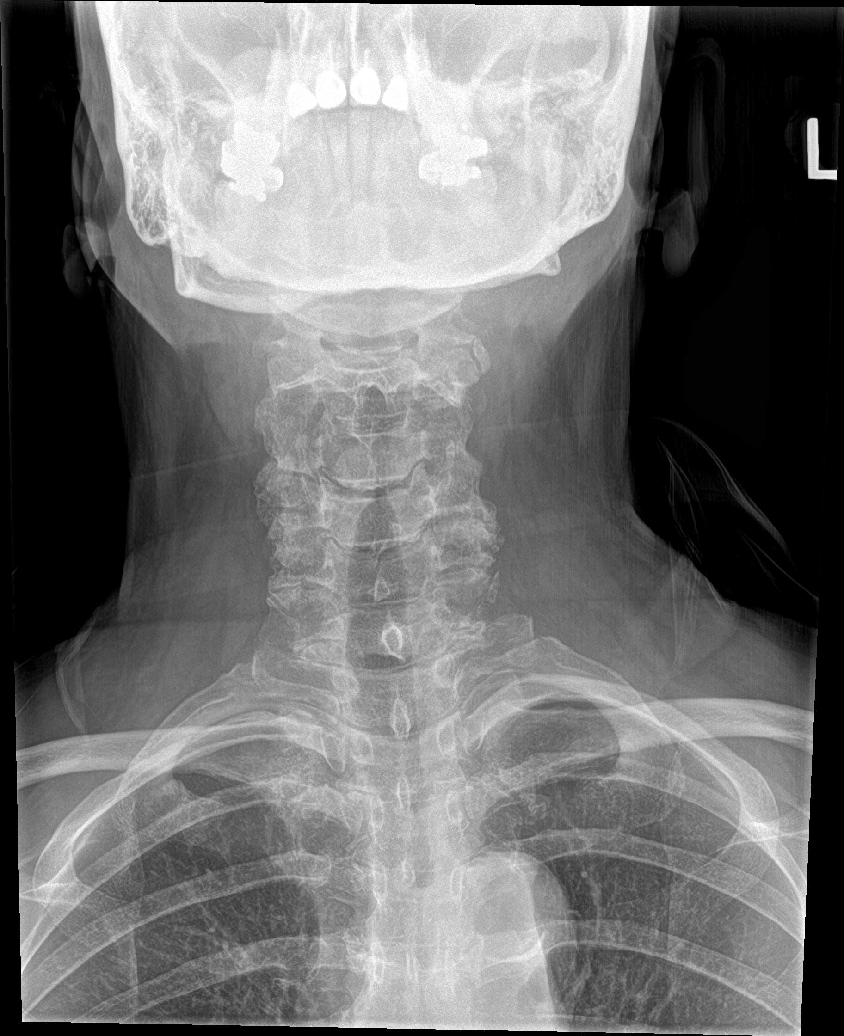

[c-spine open mouth]
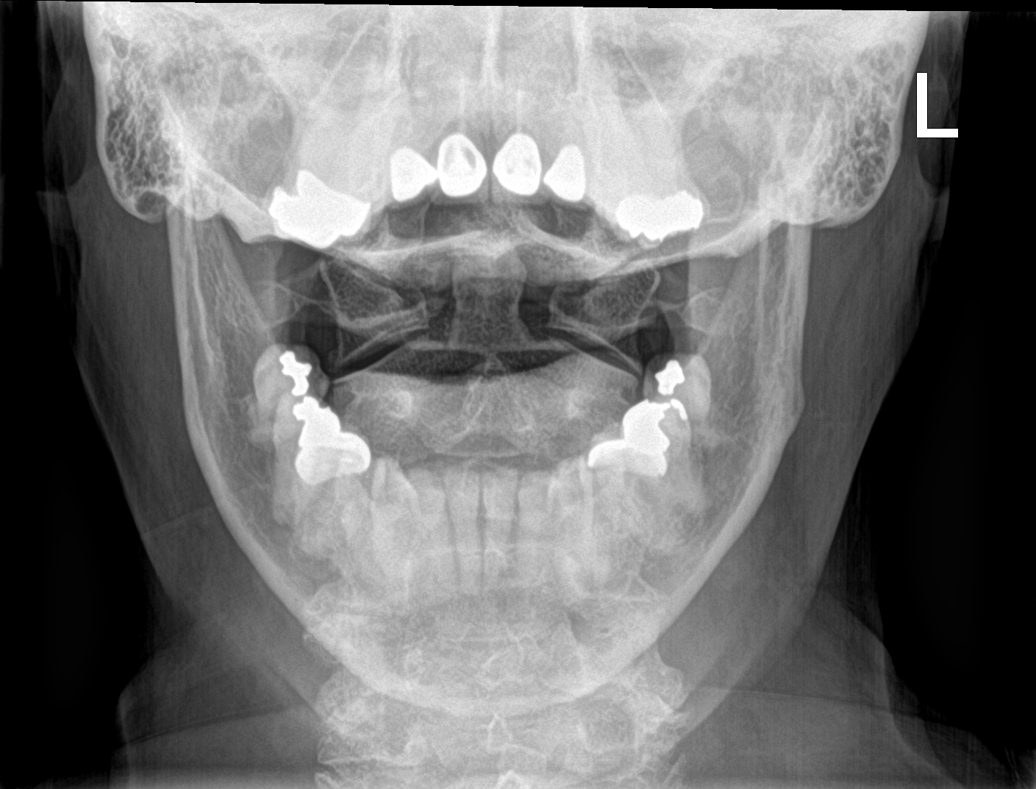

[c-spine swimmers]
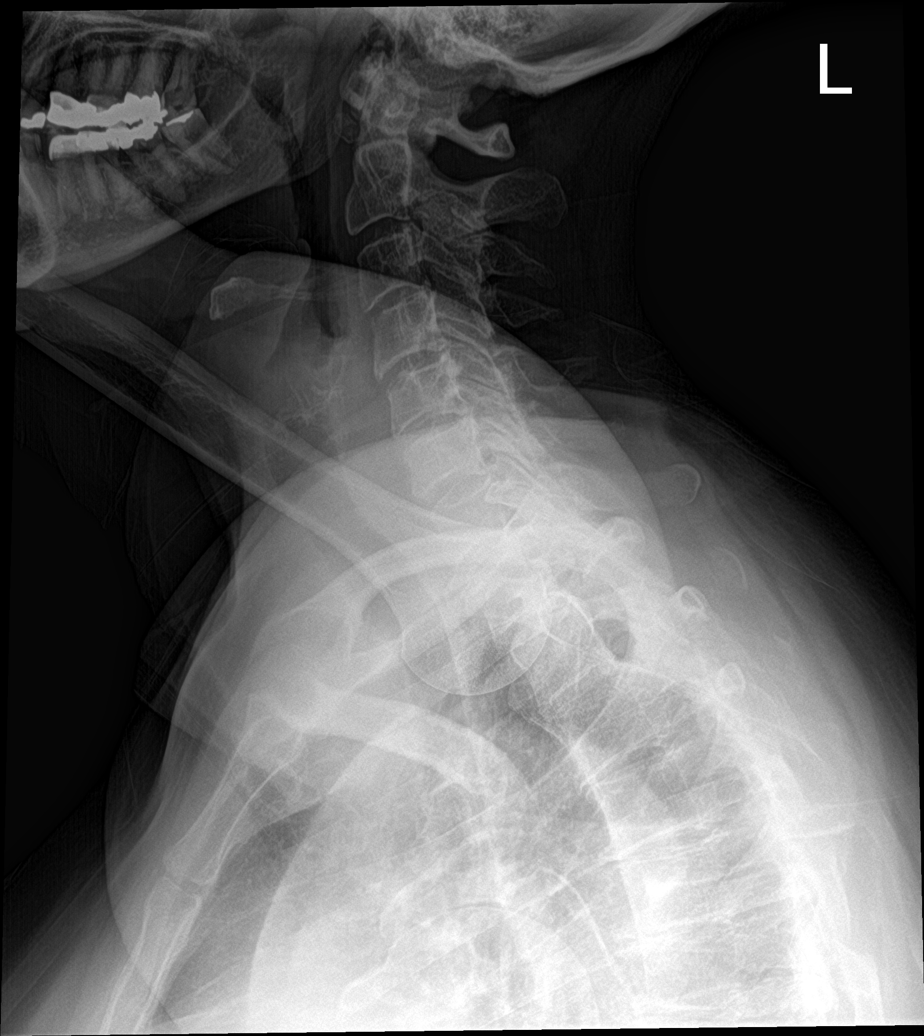

[6 of 6 positions shown; findings below may reference images not displayed]

FINDINGS: Prevertebral soft tissues are normal.

Alignment of the cervical spine is normal. On lateral view cervical
spine image through C7. Swimmer's views provided showing no
malalignment at the cervicothoracic junction. Some overlap of
structures does limit detail in this region.

Multilevel degenerative changes greatest at C4-5, C5-6 and C6-7.
This includes uncovertebral spurring and facet hypertrophy.
RIGHT-sided neural foraminal narrowing greatest at C4-5 with
moderate narrowing. Mild narrowing at C3-4.

Moderate neural foraminal narrowing at C6-7 on the LEFT.
IMPRESSION: Multilevel degenerative changes as described with moderate neural
foraminal narrowing. No signs of acute fracture or malalignment.

## 2021-04-14 ENCOUNTER — Ambulatory Visit: Payer: Medicare Other | Admitting: Family Medicine

## 2021-04-14 NOTE — Patient Instructions (Incomplete)
Good to see you again today. I will be in touch with your labs asap  ?

## 2021-04-14 NOTE — Progress Notes (Deleted)
Therapist, [facility] at [facility] ?[address], [address] ?[place], [place] [address] ?336 [phone] ?Fax [phone] ? ?Date:  04/14/2021  ? ?Name:  Misty Rivas   DOB:  57/32/2024   MRN:  [id number] ? ?PCP:  Darreld Mclean, MD  ? ? ?Chief Complaint: No chief complaint on file. ? ? ?History of Present Illness: ? ?Misty Rivas is a 76 y.o. very pleasant female patient who presents with the following: ? ?Pt seen today with concern of pain in her side ?Last seen by myself in August - history of hypertension, Nash, osteopenia, vitamin D deficiency ?Most recently seen by myself in February ? ?Covid booster ?Due for tetanus  ?Labs are due for update today  ? ?Patient Active Problem List  ? Diagnosis Date Noted  ? Vitamin D deficiency 08/11/2020  ? Osteopenia 09/09/2019  ? NASH (nonalcoholic steatohepatitis) 04/18/2019  ? Essential hypertension 11/23/2016  ? Estrogen deficiency 11/23/2016  ? ? ?Past Medical History:  ?Diagnosis Date  ? Acid reflux   ? Anxiety   ? Arthritis   ? Cataract   ? removed both eyes   ? Degenerative joint disease of knee   ? Depression   ? Diverticulosis   ? Fibromyalgia   ? Gastric polyp   ? GERD (gastroesophageal reflux disease)   ? Hiatal hernia   ? Hypertension   ? controlled   ? ? ?Past Surgical History:  ?Procedure Laterality Date  ? CESAREAN SECTION    ? x2  ? CHOLECYSTECTOMY    ? COLONOSCOPY    ? POLYPECTOMY    ? TONSILLECTOMY  1955  ? TOTAL KNEE ARTHROPLASTY Left 06/19/2017  ? UPPER GASTROINTESTINAL ENDOSCOPY    ? ? ?Social History  ? ?Tobacco Use  ? Smoking status: Never  ? Smokeless tobacco: Never  ?Vaping Use  ? Vaping Use: Never used  ?Substance Use Topics  ? Alcohol use: Yes  ?  Comment: occ  ? Drug use: No  ? ? ?Family History  ?Problem Relation Age of Onset  ? Mental illness Mother   ? Heart disease Father   ? Prostate cancer Father   ? Breast cancer Sister   ? Colon cancer Neg Hx   ? Stomach cancer Neg Hx   ? Rectal cancer Neg Hx   ? Esophageal cancer Neg Hx   ?  Liver cancer Neg Hx   ? Colon polyps Neg Hx   ? ? ?Allergies  ?Allergen Reactions  ? Lisinopril Other (See Comments)  ?  Per pt cannot tolerate medication makes her fatigued  ? Sulfa Antibiotics Other (See Comments) and Rash  ?  Other reaction(s): Other (See Comments) ?Unknown reaction per pt, hasnt taken in a long time. ?Unknown reaction per pt, hasnt taken in a long time. ?  ? ? ?Medication list has been reviewed and updated. ? ?Current Outpatient Medications on File Prior to Visit  ?Medication Sig Dispense Refill  ? amLODipine (NORVASC) 5 MG tablet TAKE 1 TABLET (5 MG TOTAL) DAILY BY MOUTH. 90 tablet 1  ? aspirin 81 MG EC tablet Take by mouth.    ? Calcium Carb-Cholecalciferol (CALCIUM-VITAMIN D) 500-200 MG-UNIT tablet Take by mouth.    ? Cholecalciferol (VITAMIN D3) 1.25 MG (50000 UT) CAPS Take 1 weekly for 12 weeks 12 capsule 0  ? escitalopram (LEXAPRO) 20 MG tablet TAKE ONE TABLET BY MOUTH ONE TIME DAILY 90 tablet 1  ? influenza vaccine adjuvanted (FLUAD) 0.5 ML injection Inject into the muscle.  0.5 mL 0  ? levocetirizine (XYZAL) 5 MG tablet TAKE 1 TABLET BY MOUTH EVERY DAY IN THE EVENING 30 tablet 0  ? metoprolol tartrate (LOPRESSOR) 25 MG tablet Take 1 tablet (25 mg total) by mouth 2 (two) times daily. 180 tablet 3  ? Multiple Vitamin (MULTI-VITAMINS) TABS Take by mouth.    ? naproxen sodium (ALEVE) 220 MG tablet Take 220 mg by mouth as needed.     ? ?No current facility-administered medications on file prior to visit.  ? ? ?Review of Systems: ? ?As per HPI- otherwise negative. ? ? ?Physical Examination: ?There were no vitals filed for this visit. ?There were no vitals filed for this visit. ?There is no height or weight on file to calculate BMI. ?Ideal Body Weight:   ? ?GEN: no acute distress. ?HEENT: Atraumatic, Normocephalic.  ?Ears and Nose: No external deformity. ?CV: RRR, No M/G/R. No JVD. No thrill. No extra heart sounds. ?PULM: CTA B, no wheezes, crackles, rhonchi. No retractions. No resp. distress.  No accessory muscle use. ?ABD: S, NT, ND, +BS. No rebound. No HSM. ?EXTR: No c/c/e ?PSYCH: Normally interactive. Conversant.  ? ? ?Assessment and Plan: ?*** ? ?Signed ?Lamar Blinks, MD ? ?

## 2021-04-16 DIAGNOSIS — Z20828 Contact with and (suspected) exposure to other viral communicable diseases: Secondary | ICD-10-CM | POA: Diagnosis not present

## 2021-04-16 DIAGNOSIS — Z1152 Encounter for screening for COVID-19: Secondary | ICD-10-CM | POA: Diagnosis not present

## 2021-04-26 ENCOUNTER — Ambulatory Visit: Payer: Medicare Other | Admitting: Family Medicine

## 2021-04-26 DIAGNOSIS — Z20822 Contact with and (suspected) exposure to covid-19: Secondary | ICD-10-CM | POA: Diagnosis not present

## 2021-05-01 NOTE — Progress Notes (Addendum)
Therapist, music at [facility] ?[address], [address] ?[place], [place] [address] ?336 [phone] ?Fax [phone] ? ?Date:  05/05/2021  ? ?Name:  Misty Rivas   DOB:  [birth year]   MRN:  [id number] ? ?PCP:  Darreld Mclean, MD  ? ? ?Chief Complaint: Diarrhea ([place]ian in L [place], [place]./Concerns/ questions: one medication that is in her list. /) ? ? ?History of Present Illness: ? ?Misty Rivas is a 76 y.o. very pleasant female patient who presents with the following: ? ?Pt seen today with concern of diarrhea for several days- history of hypertension, Nash, osteopenia, vitamin D deficiency ? ?Last seen by myself in Sep 02, 2022 ?Her husband died in 08-Mar-2019; he had dementia and was under hospice care ?She has 2 sons in [place]  ?She actually has a new partner, they are engaged in plan to be married in 2 weeks.  She is excited about this new chapter in her life and is glad to again be partnered up ? ?Covid booster ?Tetanus booster due-update today ?Can update full labs today ?Liver tests only done in August- stable ? ?She notes onset of diarrhea which started about a week ago and last for 4 days ?She is not aware of any unusual foods or sick contacts ?No associated vomiting. ?She started taking imodium one week ago- she took it for 4 days and then the diarrhea resolved ?No fever , no blood in stool noted ? ?She has noted left upper quadrant pain intermittently for about 3 weeks.  This has her concerned.  She did fall in her garden and bruised this area, but the pain predates this injury ? ?She is now eating normally and having normal BM  ? ?She notes she has been exercising, has lost weight and wonders if we can reduce her blood pressure medication ? ?Wt Readings from Last 3 Encounters:  ?05/05/21 163 lb 4.8 oz (74.1 kg)  ?08/12/20 170 lb (77.1 kg)  ?08/05/20 170 lb (77.1 kg)  ? ? ? ?Patient Active Problem List  ? Diagnosis Date Noted  ? Vitamin D deficiency  08/11/2020  ? Osteopenia 09/09/2019  ? NASH (nonalcoholic steatohepatitis) 04/18/2019  ? Essential hypertension 11/23/2016  ? Estrogen deficiency 11/23/2016  ? ? ?Past Medical History:  ?Diagnosis Date  ? Acid reflux   ? Anxiety   ? Arthritis   ? Cataract   ? removed both eyes   ? Degenerative joint disease of knee   ? Depression   ? Diverticulosis   ? Fibromyalgia   ? Gastric polyp   ? GERD (gastroesophageal reflux disease)   ? Hiatal hernia   ? Hypertension   ? controlled   ? ? ?Past Surgical History:  ?Procedure Laterality Date  ? CESAREAN SECTION    ? x2  ? CHOLECYSTECTOMY    ? COLONOSCOPY    ? POLYPECTOMY    ? TONSILLECTOMY  1955  ? TOTAL KNEE ARTHROPLASTY Left 06/19/2017  ? UPPER GASTROINTESTINAL ENDOSCOPY    ? ? ?Social History  ? ?Tobacco Use  ? Smoking status: Never  ? Smokeless tobacco: Never  ?Vaping Use  ? Vaping Use: Never used  ?Substance Use Topics  ? Alcohol use: Yes  ?  Comment: occ  ? Drug use: No  ? ? ?Family History  ?Problem Relation Age of Onset  ? Mental illness Mother   ? Heart disease Father   ? Prostate cancer Father   ?  Breast cancer Sister   ? Colon cancer Neg Hx   ? Stomach cancer Neg Hx   ? Rectal cancer Neg Hx   ? Esophageal cancer Neg Hx   ? Liver cancer Neg Hx   ? Colon polyps Neg Hx   ? ? ?Allergies  ?Allergen Reactions  ? Lisinopril Other (See Comments)  ?  Per pt cannot tolerate medication makes her fatigued  ? Sulfa Antibiotics Other (See Comments) and Rash  ?  Other reaction(s): Other (See Comments) ?Unknown reaction per pt, hasnt taken in a long time. ?Unknown reaction per pt, hasnt taken in a long time. ?  ? ? ?Medication list has been reviewed and updated. ? ?Current Outpatient Medications on File Prior to Visit  ?Medication Sig Dispense Refill  ? amLODipine (NORVASC) 5 MG tablet TAKE 1 TABLET (5 MG TOTAL) DAILY BY MOUTH. 90 tablet 1  ? aspirin 81 MG EC tablet Take by mouth.    ? Calcium Carb-Cholecalciferol (CALCIUM-VITAMIN D) 500-200 MG-UNIT tablet Take by mouth.    ?  Cholecalciferol (VITAMIN D3) 1.25 MG (50000 UT) CAPS Take 1 weekly for 12 weeks 12 capsule 0  ? escitalopram (LEXAPRO) 20 MG tablet TAKE ONE TABLET BY MOUTH ONE TIME DAILY 90 tablet 1  ? influenza vaccine adjuvanted (FLUAD) 0.5 ML injection Inject into the muscle. 0.5 mL 0  ? levocetirizine (XYZAL) 5 MG tablet TAKE 1 TABLET BY MOUTH EVERY DAY IN THE EVENING 30 tablet 0  ? metoprolol tartrate (LOPRESSOR) 25 MG tablet Take 1 tablet (25 mg total) by mouth 2 (two) times daily. 180 tablet 3  ? Multiple Vitamin (MULTI-VITAMINS) TABS Take by mouth.    ? naproxen sodium (ALEVE) 220 MG tablet Take 220 mg by mouth as needed.     ? ?No current facility-administered medications on file prior to visit.  ? ? ?Review of Systems: ? ?As per HPI- otherwise negative. ? ? ?Physical Examination: ?Vitals:  ? 05/05/21 1446  ?BP: 110/78  ?Pulse: 66  ?Resp: 18  ?Temp: 97.8 ?F (36.6 ?C)  ?SpO2: 95%  ? ?Vitals:  ? 05/05/21 1446  ?Weight: 163 lb 4.8 oz (74.1 kg)  ?Height: 5' 4"  (1.626 m)  ? ?Body mass index is 28.03 kg/m?. ?Ideal Body Weight: Weight in (lb) to have BMI = 25: 145.3 ? ?GEN: no acute distress.  Looks well, minimally overweight ?HEENT: Atraumatic, Normocephalic.  ?Ears and Nose: No external deformity. ?CV: RRR, No M/G/R. No JVD. No thrill. No extra heart sounds. ?PULM: CTA B, no wheezes, crackles, rhonchi. No retractions. No resp. distress. No accessory muscle use. ?ABD: S, NT, ND, +BS. No rebound. No HSM.  Abdominal exam benign currently ?EXTR: No c/c/e ?PSYCH: Normally interactive. Conversant.  ?There is a small bruise over her left lateral ribs from recent fall. ?She recently cut her right palm out in her garden, no wound care is needed at this time ? ?Assessment and Plan: ?Left upper quadrant pain - Plan: CBC, Comprehensive metabolic panel, Amylase, Lipase, CT Abdomen Pelvis W Contrast ? ?Essential hypertension - Plan: amLODipine (NORVASC) 2.5 MG tablet ? ?Wound of abdomen - Plan: Td vaccine greater than or equal to 7yo  preservative free IM ? ?Wound of skin - Plan: Td vaccine greater than or equal to 7yo preservative free IM ? ?Open wound of right hand without foreign body, unspecified wound type, initial encounter - Plan: Td vaccine greater than or equal to 7yo preservative free IM ? ?Patient seen today with a couple of concerns ?Update tetanus, recommended  COVID booster ?History of Misty Rivas, she is concerned about intermittent left upper quadrant pain.  She would appreciate getting a CT scan to be sure nothing is missed which is reasonable ?Obtain labs as above, ordered CT ?We will have her decrease amlodipine to 2.5 mg daily ?Signed ?Lamar Blinks, MD ? ?Received labs as below 4/27- message to pt ? ?Results for orders placed or performed in visit on 05/05/21  ?CBC  ?Result Value Ref Range  ? WBC 8.5 4.0 - 10.5 K/uL  ? RBC 4.69 3.87 - 5.11 Mil/uL  ? Platelets 318.0 150.0 - 400.0 K/uL  ? Hemoglobin 13.9 12.0 - 15.0 g/dL  ? HCT 41.0 36.0 - 46.0 %  ? MCV 87.4 78.0 - 100.0 fl  ? MCHC 33.9 30.0 - 36.0 g/dL  ? RDW 12.7 11.5 - 15.5 %  ?Comprehensive metabolic panel  ?Result Value Ref Range  ? Sodium 140 135 - 145 mEq/L  ? Potassium 3.6 3.5 - 5.1 mEq/L  ? Chloride 105 96 - 112 mEq/L  ? CO2 26 19 - 32 mEq/L  ? Glucose, Bld 106 (H) 70 - 99 mg/dL  ? BUN 17 6 - 23 mg/dL  ? Creatinine, Ser 0.81 0.40 - 1.20 mg/dL  ? Total Bilirubin 0.5 0.2 - 1.2 mg/dL  ? Alkaline Phosphatase 77 39 - 117 U/L  ? AST 23 0 - 37 U/L  ? ALT 19 0 - 35 U/L  ? Total Protein 6.5 6.0 - 8.3 g/dL  ? Albumin 4.3 3.5 - 5.2 g/dL  ? GFR 71.52 >60.00 mL/min  ? Calcium 10.0 8.4 - 10.5 mg/dL  ?Amylase  ?Result Value Ref Range  ? Amylase 26 (L) 27 - 131 U/L  ?Lipase  ?Result Value Ref Range  ? Lipase 44.0 11.0 - 59.0 U/L  ? ? ? ?

## 2021-05-05 ENCOUNTER — Ambulatory Visit (INDEPENDENT_AMBULATORY_CARE_PROVIDER_SITE_OTHER): Payer: Medicare Other | Admitting: Family Medicine

## 2021-05-05 VITALS — BP 110/78 | HR 66 | Temp 97.8°F | Resp 18 | Ht 64.0 in | Wt 163.3 lb

## 2021-05-05 DIAGNOSIS — S61401A Unspecified open wound of right hand, initial encounter: Secondary | ICD-10-CM | POA: Diagnosis not present

## 2021-05-05 DIAGNOSIS — I10 Essential (primary) hypertension: Secondary | ICD-10-CM

## 2021-05-05 DIAGNOSIS — S31109A Unspecified open wound of abdominal wall, unspecified quadrant without penetration into peritoneal cavity, initial encounter: Secondary | ICD-10-CM

## 2021-05-05 DIAGNOSIS — Z23 Encounter for immunization: Secondary | ICD-10-CM

## 2021-05-05 DIAGNOSIS — T148XXA Other injury of unspecified body region, initial encounter: Secondary | ICD-10-CM | POA: Diagnosis not present

## 2021-05-05 DIAGNOSIS — R1012 Left upper quadrant pain: Secondary | ICD-10-CM | POA: Diagnosis not present

## 2021-05-05 MED ORDER — AMLODIPINE BESYLATE 2.5 MG PO TABS: 2.5000 mg | ORAL_TABLET | Freq: Every day | ORAL | 3 refills | Status: DC

## 2021-05-05 NOTE — Patient Instructions (Signed)
Good to see you today!  I will be in touch with your labs asap and we will get you set up for a CT  ?Reduce dose of amlodipine to 2.5 mg daily- if you are able to monitor BP at home our goal is for 110- 135/70- 85 ? ?Tetanus booster given today  ?

## 2021-05-06 ENCOUNTER — Encounter: Payer: Self-pay | Admitting: Family Medicine

## 2021-05-06 LAB — COMPREHENSIVE METABOLIC PANEL
ALT: 19 U/L (ref 0–35)
AST: 23 U/L (ref 0–37)
Albumin: 4.3 g/dL (ref 3.5–5.2)
Alkaline Phosphatase: 77 U/L (ref 39–117)
BUN: 17 mg/dL (ref 6–23)
CO2: 26 mEq/L (ref 19–32)
Calcium: 10 mg/dL (ref 8.4–10.5)
Chloride: 105 mEq/L (ref 96–112)
Creatinine, Ser: 0.81 mg/dL (ref 0.40–1.20)
GFR: 71.52 mL/min (ref >60.00)
Glucose, Bld: 106 mg/dL — ABNORMAL HIGH (ref 70–99)
Potassium: 3.6 mEq/L (ref 3.5–5.1)
Sodium: 140 mEq/L (ref 135–145)
Total Bilirubin: 0.5 mg/dL (ref 0.2–1.2)
Total Protein: 6.5 g/dL (ref 6.0–8.3)

## 2021-05-06 LAB — CBC
HCT: 41 % (ref 36.0–46.0)
Hemoglobin: 13.9 g/dL (ref 12.0–15.0)
MCHC: 33.9 g/dL (ref 30.0–36.0)
MCV: 87.4 fl (ref 78.0–100.0)
Platelets: 318 10*3/uL (ref 150.0–400.0)
RBC: 4.69 Mil/uL (ref 3.87–5.11)
RDW: 12.7 % (ref 11.5–15.5)
WBC: 8.5 10*3/uL (ref 4.0–10.5)

## 2021-05-06 LAB — LIPASE: Lipase: 44 U/L (ref 11.0–59.0)

## 2021-05-06 LAB — AMYLASE: Amylase: 26 U/L — ABNORMAL LOW (ref 27–131)

## 2021-05-10 DIAGNOSIS — Z20822 Contact with and (suspected) exposure to covid-19: Secondary | ICD-10-CM | POA: Diagnosis not present

## 2021-05-10 DIAGNOSIS — R059 Cough, unspecified: Secondary | ICD-10-CM | POA: Diagnosis not present

## 2021-05-10 DIAGNOSIS — R051 Acute cough: Secondary | ICD-10-CM | POA: Diagnosis not present

## 2021-05-11 ENCOUNTER — Ambulatory Visit (HOSPITAL_BASED_OUTPATIENT_CLINIC_OR_DEPARTMENT_OTHER)
Admission: RE | Admit: 2021-05-11 | Discharge: 2021-05-11 | Disposition: A | Discharge status: Home / Self Care | Payer: Medicare Other | Source: Ambulatory Visit | Attending: Family Medicine | Admitting: Family Medicine

## 2021-05-11 ENCOUNTER — Encounter (HOSPITAL_BASED_OUTPATIENT_CLINIC_OR_DEPARTMENT_OTHER): Payer: Self-pay

## 2021-05-11 DIAGNOSIS — I700 Atherosclerosis of aorta: Secondary | ICD-10-CM | POA: Diagnosis not present

## 2021-05-11 DIAGNOSIS — R1012 Left upper quadrant pain: Secondary | ICD-10-CM | POA: Diagnosis not present

## 2021-05-11 MED ORDER — IOHEXOL 300 MG/ML  SOLN
100.0000 mL | Freq: Once | INTRAMUSCULAR | Status: AC | PRN
  Administered 2021-05-11: 100 mL via INTRAVENOUS

## 2021-05-11 NOTE — Telephone Encounter (Signed)
Pt is not able to get into mychart and would like someone to call her with lab results.  ?

## 2021-05-11 NOTE — Telephone Encounter (Signed)
Tried calling. Left vm to return call.  ?

## 2021-05-12 ENCOUNTER — Ambulatory Visit: Payer: Medicare Other | Admitting: Family Medicine

## 2021-05-12 ENCOUNTER — Encounter: Payer: Self-pay | Admitting: Family Medicine

## 2021-07-16 DIAGNOSIS — Z1231 Encounter for screening mammogram for malignant neoplasm of breast: Secondary | ICD-10-CM | POA: Diagnosis not present

## 2021-07-16 LAB — HM MAMMOGRAPHY

## 2021-07-17 ENCOUNTER — Other Ambulatory Visit: Payer: Self-pay | Admitting: Family Medicine

## 2021-07-17 DIAGNOSIS — I10 Essential (primary) hypertension: Secondary | ICD-10-CM

## 2021-07-19 DIAGNOSIS — H43813 Vitreous degeneration, bilateral: Secondary | ICD-10-CM | POA: Diagnosis not present

## 2021-07-19 DIAGNOSIS — H52203 Unspecified astigmatism, bilateral: Secondary | ICD-10-CM | POA: Diagnosis not present

## 2021-07-20 ENCOUNTER — Encounter: Payer: Self-pay | Admitting: *Deleted

## 2021-07-30 ENCOUNTER — Telehealth: Payer: Self-pay | Admitting: Family Medicine

## 2021-07-30 NOTE — Telephone Encounter (Signed)
Left message for patient to call back and schedule Medicare Annual Wellness Visit (AWV).   Please offer to do virtually or by telephone.  Left office number and my jabber [phone].  Last AWV:08/05/2020  Please schedule at anytime with Nurse Health Advisor.

## 2021-09-20 ENCOUNTER — Telehealth: Payer: Self-pay | Admitting: Family Medicine

## 2021-09-20 NOTE — Telephone Encounter (Signed)
Left message for patient to call back and schedule Medicare Annual Wellness Visit (AWV).   Please offer to do virtually or by telephone.  Left office number and my jabber [phone].  Last AWV:08/05/2020  Please schedule at anytime with Nurse Health Advisor.

## 2021-09-23 ENCOUNTER — Ambulatory Visit (INDEPENDENT_AMBULATORY_CARE_PROVIDER_SITE_OTHER): Payer: Medicare Other | Admitting: *Deleted

## 2021-09-23 ENCOUNTER — Encounter: Payer: Self-pay | Admitting: Family Medicine

## 2021-09-23 VITALS — BP 123/81 | HR 67 | Ht 64.0 in | Wt 165.8 lb

## 2021-09-23 DIAGNOSIS — Z23 Encounter for immunization: Secondary | ICD-10-CM

## 2021-09-23 DIAGNOSIS — Z0000 Encounter for general adult medical examination without abnormal findings: Secondary | ICD-10-CM | POA: Diagnosis not present

## 2021-09-23 NOTE — Patient Instructions (Signed)
Ms. Misty Rivas , Thank you for taking time to come for your Medicare Wellness Visit. I appreciate your ongoing commitment to your health goals. Please review the following plan we discussed and let me know if I can assist you in the future.   These are the goals we discussed:  Goals      Patient Stated     Continue eating healthy & exercising        This is a list of the screening recommended for you and due dates:  Health Maintenance  Topic Date Due   COVID-19 Vaccine (4 - Pfizer series) 01/27/2020   Flu Shot  08/10/2021   Mammogram  07/17/2023   Colon Cancer Screening  11/06/2023   Tetanus Vaccine  05/06/2031   Pneumonia Vaccine  Completed   DEXA scan (bone density measurement)  Completed   Hepatitis C Screening: USPSTF Recommendation to screen - Ages 78-79 yo.  Completed   Zoster (Shingles) Vaccine  Completed   HPV Vaccine  Aged Out      Next appointment: Follow up in one year for your annual wellness visit    Preventive Care 65 Years and Older, Female Preventive care refers to lifestyle choices and visits with your health care provider that can promote health and wellness. What does preventive care include? A yearly physical exam. This is also called an annual well check. Dental exams once or twice a year. Routine eye exams. Ask your health care provider how often you should have your eyes checked. Personal lifestyle choices, including: Daily care of your teeth and gums. Regular physical activity. Eating a healthy diet. Avoiding tobacco and drug use. Limiting alcohol use. Practicing safe sex. Taking low-dose aspirin every day. Taking vitamin and mineral supplements as recommended by your health care provider. What happens during an annual well check? The services and screenings done by your health care provider during your annual well check will depend on your age, overall health, lifestyle risk factors, and family history of disease. Counseling  Your health care  provider may ask you questions about your: Alcohol use. Tobacco use. Drug use. Emotional well-being. Home and relationship well-being. Sexual activity. Eating habits. History of falls. Memory and ability to understand (cognition). Work and work Statistician. Reproductive health. Screening  You may have the following tests or measurements: Height, weight, and BMI. Blood pressure. Lipid and cholesterol levels. These may be checked every 5 years, or more frequently if you are over 45 years old. Skin check. Lung cancer screening. You may have this screening every year starting at age 53 if you have a 30-pack-year history of smoking and currently smoke or have quit within the past 15 years. Fecal occult blood test (FOBT) of the stool. You may have this test every year starting at age 36. Flexible sigmoidoscopy or colonoscopy. You may have a sigmoidoscopy every 5 years or a colonoscopy every 10 years starting at age 70. Hepatitis C blood test. Hepatitis B blood test. Sexually transmitted disease (STD) testing. Diabetes screening. This is done by checking your blood sugar (glucose) after you have not eaten for a while (fasting). You may have this done every 1-3 years. Bone density scan. This is done to screen for osteoporosis. You may have this done starting at age 58. Mammogram. This may be done every 1-2 years. Talk to your health care provider about how often you should have regular mammograms. Talk with your health care provider about your test results, treatment options, and if necessary, the need for more  tests. Vaccines  Your health care provider may recommend certain vaccines, such as: Influenza vaccine. This is recommended every year. Tetanus, diphtheria, and acellular pertussis (Tdap, Td) vaccine. You may need a Td booster every 10 years. Zoster vaccine. You may need this after age 36. Pneumococcal 13-valent conjugate (PCV13) vaccine. One dose is recommended after age  73. Pneumococcal polysaccharide (PPSV23) vaccine. One dose is recommended after age 90. Talk to your health care provider about which screenings and vaccines you need and how often you need them. This information is not intended to replace advice given to you by your health care provider. Make sure you discuss any questions you have with your health care provider. Document Released: 01/23/2015 Document Revised: 09/16/2015 Document Reviewed: 10/28/2014 Elsevier Interactive Patient Education  2017 [place] Prevention in the Home Falls can cause injuries. They can happen to people of all ages. There are many things you can do to make your home safe and to help prevent falls. What can I do on the outside of my home? Regularly fix the edges of walkways and driveways and fix any cracks. Remove anything that might make you trip as you walk through a door, such as a raised step or threshold. Trim any bushes or trees on the path to your home. Use bright outdoor lighting. Clear any walking paths of anything that might make someone trip, such as rocks or tools. Regularly check to see if handrails are loose or broken. Make sure that both sides of any steps have handrails. Any raised decks and porches should have guardrails on the edges. Have any leaves, snow, or ice cleared regularly. Use sand or salt on walking paths during winter. Clean up any spills in your garage right away. This includes oil or grease spills. What can I do in the bathroom? Use night lights. Install grab bars by the toilet and in the tub and shower. Do not use towel bars as grab bars. Use non-skid mats or decals in the tub or shower. If you need to sit down in the shower, use a plastic, non-slip stool. Keep the floor dry. Clean up any water that spills on the floor as soon as it happens. Remove soap buildup in the tub or shower regularly. Attach bath mats securely with double-sided non-slip rug tape. Do not have throw  rugs and other things on the floor that can make you trip. What can I do in the bedroom? Use night lights. Make sure that you have a light by your bed that is easy to reach. Do not use any sheets or blankets that are too big for your bed. They should not hang down onto the floor. Have a firm chair that has side arms. You can use this for support while you get dressed. Do not have throw rugs and other things on the floor that can make you trip. What can I do in the kitchen? Clean up any spills right away. Avoid walking on wet floors. Keep items that you use a lot in easy-to-reach places. If you need to reach something above you, use a strong step stool that has a grab bar. Keep electrical cords out of the way. Do not use floor polish or wax that makes floors slippery. If you must use wax, use non-skid floor wax. Do not have throw rugs and other things on the floor that can make you trip. What can I do with my stairs? Do not leave any items on the stairs. Make sure  that there are handrails on both sides of the stairs and use them. Fix handrails that are broken or loose. Make sure that handrails are as long as the stairways. Check any carpeting to make sure that it is firmly attached to the stairs. Fix any carpet that is loose or worn. Avoid having throw rugs at the top or bottom of the stairs. If you do have throw rugs, attach them to the floor with carpet tape. Make sure that you have a light switch at the top of the stairs and the bottom of the stairs. If you do not have them, ask someone to add them for you. What else can I do to help prevent falls? Wear shoes that: Do not have high heels. Have rubber bottoms. Are comfortable and fit you well. Are closed at the toe. Do not wear sandals. If you use a stepladder: Make sure that it is fully opened. Do not climb a closed stepladder. Make sure that both sides of the stepladder are locked into place. Ask someone to hold it for you, if  possible. Clearly mark and make sure that you can see: Any grab bars or handrails. First and last steps. Where the edge of each step is. Use tools that help you move around (mobility aids) if they are needed. These include: Canes. Walkers. Scooters. Crutches. Turn on the lights when you go into a dark area. Replace any light bulbs as soon as they burn out. Set up your furniture so you have a clear path. Avoid moving your furniture around. If any of your floors are uneven, fix them. If there are any pets around you, be aware of where they are. Review your medicines with your doctor. Some medicines can make you feel dizzy. This can increase your chance of falling. Ask your doctor what other things that you can do to help prevent falls. This information is not intended to replace advice given to you by your health care provider. Make sure you discuss any questions you have with your health care provider. Document Released: 10/23/2008 Document Revised: 06/04/2015 Document Reviewed: 01/31/2014 Elsevier Interactive Patient Education  2017 [employer].

## 2021-09-23 NOTE — Progress Notes (Signed)
Subjective:   Shaunte Tuft is a 76 y.o. female who presents for Medicare Annual (Subsequent) preventive examination.  Review of Systems    Defer to PCP Cardiac Risk Factors include: hypertension;advanced age (>84mn, >>76women)     Objective:    Today's Vitals   09/23/21 1337  BP: 123/81  Pulse: 67  Weight: 165 lb 12.8 oz (75.2 kg)  Height: 5' 4"  (1.626 m)   Body mass index is 28.46 kg/m.     09/23/2021    1:39 PM 08/05/2020   11:47 AM 07/16/2019    1:18 PM 11/16/2017    2:25 PM  Advanced Directives  Does Patient Have a Medical Advance Directive? Yes Yes Yes Yes  Type of AParamed[place]of [place] will [place] will  [place] will  Does patient want to make changes to medical advance directive? No - Patient declined     Copy of [place] Chart? No - copy requested No - copy requested  No - copy requested    Current Medications (verified) Outpatient Encounter Medications as of 09/23/2021  Medication Sig   amLODipine (NORVASC) 2.5 MG tablet Take 1 tablet (2.5 mg total) by mouth daily.   aspirin 81 MG EC tablet Take by mouth.   Calcium Carb-Cholecalciferol (CALCIUM-VITAMIN D) 500-200 MG-UNIT tablet Take by mouth.   Cholecalciferol (VITAMIN D3) 1.25 MG (50000 UT) CAPS Take 1 weekly for 12 weeks   escitalopram (LEXAPRO) 20 MG tablet TAKE ONE TABLET BY MOUTH ONE TIME DAILY   levocetirizine (XYZAL) 5 MG tablet TAKE 1 TABLET BY MOUTH EVERY DAY IN THE EVENING   metoprolol tartrate (LOPRESSOR) 25 MG tablet TAKE ONE TABLET BY MOUTH TWICE A DAY   Multiple Vitamin (MULTI-VITAMINS) TABS Take by mouth.   naproxen sodium (ALEVE) 220 MG tablet Take 220 mg by mouth as needed.    [DISCONTINUED] influenza vaccine adjuvanted (FLUAD) 0.5 ML injection Inject into the muscle.   No facility-administered encounter medications on file as of 09/23/2021.    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   UPPER GASTROINTESTINAL ENDOSCOPY     Family History  Problem Relation Age of Onset   Mental illness Mother    Heart disease Father    Prostate cancer Father    Breast cancer Sister    Colon cancer Neg Hx    Stomach cancer Neg Hx    Rectal cancer Neg Hx    Esophageal cancer Neg Hx    Liver cancer Neg Hx    Colon polyps Neg Hx    Social History   Socioeconomic History   Marital status: Widowed    Spouse name: Not on file   Number of children: 2   Years of education: Not on file  Highest education level: Not on file  Occupational History   Occupation: retired  Tobacco Use   Smoking status: Never   Smokeless tobacco: Never  Vaping Use   Vaping Use: Never used  Substance and Sexual Activity   Alcohol use: Yes    Comment: occ   Drug use: No   Sexual activity: Not on file  Other Topics Concern   Not on file  Social History Narrative   Not on file   Social Determinants of Health   Financial Resource Strain: Low Risk  (08/05/2020)   Overall Financial Resource Strain (CARDIA)    Difficulty of Paying Living Expenses: Not hard at all  Food Insecurity: No Food Insecurity (08/05/2020)   Hunger Vital Sign    Worried About Running Out of Food in the Last Year: Never true    [place] in the Last Year: Never true  Transportation Needs: No Transportation Needs (08/05/2020)   PRAPARE - Hydrologist  (Medical): No    Lack of Transportation (Non-Medical): No  Physical Activity: Sufficiently Active (08/05/2020)   Exercise Vital Sign    Days of Exercise per Week: 5 days    Minutes of Exercise per Session: 30 min  Stress: No Stress Concern Present (08/05/2020)   [place]    Feeling of Stress : Not at all  Social Connections: Moderately Integrated (08/05/2020)   Social Connection and Isolation Panel [NHANES]    Frequency of Communication with Friends and Family: More than three times a week    Frequency of Social Gatherings with Friends and Family: More than three times a week    Attends Religious Services: More than 4 times per year    Active Member of [employer] or Organizations: Yes    Attends Archivist Meetings: More than 4 times per year    Marital Status: Widowed    Tobacco Counseling Counseling given: Not Answered   Clinical Intake:  Pre-visit preparation completed: No  Pain : No/denies pain     Diabetes: No  How often do you need to have someone help you when you read instructions, pamphlets, or other written materials from your doctor or pharmacy?: 1 - Never  Diabetic? No   Activities of Daily Living    09/23/2021    1:41 PM  In your present state of health, do you have any difficulty performing the following activities:  Hearing? 0  Vision? 0  Difficulty concentrating or making decisions? 0  Walking or climbing stairs? 0  Dressing or bathing? 0  Doing errands, shopping? 0  Preparing Food and eating ? N  Using the Toilet? N  In the past six months, have you accidently leaked urine? Y  Do you have problems with loss of bowel control? N  Managing your Medications? N  Managing your Finances? N  Housekeeping or managing your Housekeeping? N    Patient Care Team: Copland, Gay Filler, MD as PCP - General (Family Medicine)  Indicate any recent Medical Services you may have received from  other than [facility] providers in the past year (date may be approximate).     Assessment:   This is a routine wellness examination for [place].  Hearing/Vision screen No results found.  Dietary issues and exercise activities discussed: Current Exercise Habits: Home exercise routine, Type of exercise: walking, Time (Minutes): > 60, Frequency (Times/Week): 7, Weekly Exercise (Minutes/Week): 0, Intensity: Mild, Exercise limited by: Other - see  comments   Goals Addressed   None    Depression Screen    09/23/2021    1:40 PM 08/05/2020   11:49 AM 11/16/2017    2:30 PM 11/23/2016    2:15 PM  PHQ 2/9 Scores  PHQ - 2 Score 0 0 1 0    Fall Risk    09/23/2021    1:39 PM 08/05/2020   11:49 AM 07/18/2019    2:01 PM 11/16/2017    2:30 PM 11/23/2016    2:15 PM  Fall Risk   Falls in the past year? 1 0 0 1 No  Number falls in past yr: 0 0     Injury with Fall? 0 0     Risk for fall due to : No Fall Risks      Follow up Falls evaluation completed Falls prevention discussed       FALL RISK PREVENTION PERTAINING TO THE HOME:  Any stairs in or around the home? Yes  If so, are there any without handrails? Yes  Home free of loose throw rugs in walkways, pet beds, electrical cords, etc? Yes  Adequate lighting in your home to reduce risk of falls? Yes   ASSISTIVE DEVICES UTILIZED TO PREVENT FALLS:  Life alert? No  Use of a cane, walker or w/c? No  Grab bars in the bathroom? Yes  Shower chair or bench in shower? Yes  Elevated toilet seat or a handicapped toilet? No   TIMED UP AND GO:  Was the test performed? Yes .  Length of time to ambulate 10 feet: 5 sec.   Gait steady and fast without use of assistive device  Cognitive Function:        09/23/2021    1:47 PM  6CIT Screen  What Year? 0 points  What month? 0 points  What time? 0 points  Count back from 20 0 points  Months in reverse 0 points  Repeat phrase 0 points  Total Score 0 points    Immunizations Immunization History   Administered Date(s) Administered   Fluad Quad(high Dose 65+) 10/31/2018, 11/25/2020   Influenza-Unspecified 12/06/2016, 09/11/2019   PFIZER(Purple Top)SARS-COV-2 Vaccination 01/31/2019, 02/21/2019, 12/02/2019   Pneumococcal Conjugate-13 11/27/2013   Pneumococcal Polysaccharide-23 03/31/2010, 11/23/2017   Td 05/05/2021   Tdap 12/23/2010   Zoster Recombinat (Shingrix) 09/02/2020, 01/26/2021   Zoster, Live 04/20/2009    TDAP status: Up to date  Flu Vaccine status: Completed at today's visit  Pneumococcal vaccine status: Up to date  Covid-19 vaccine status: Information provided on how to obtain vaccines.   Qualifies for Shingles Vaccine? Yes   Zostavax completed Yes   Shingrix Completed?: Yes  Screening Tests Health Maintenance  Topic Date Due   COVID-19 Vaccine (4 - Pfizer series) 01/27/2020   INFLUENZA VACCINE  08/10/2021   MAMMOGRAM  07/17/2023   COLONOSCOPY (Pts 45-77yr Insurance coverage will need to be confirmed)  11/06/2023   TETANUS/TDAP  05/06/2031   Pneumonia Vaccine 75 Years old  Completed   DEXA SCAN  Completed   Hepatitis C Screening  Completed   Zoster Vaccines- Shingrix  Completed   HPV VACCINES  Aged Out    Health Maintenance  Health Maintenance Due  Topic Date Due   COVID-19 Vaccine (4 - Pfizer series) 01/27/2020   INFLUENZA VACCINE  08/10/2021    Colorectal cancer screening: Type of screening: Colonoscopy. Completed 11/05/13. Repeat every 10 years  Mammogram status: Completed 07/16/21. Repeat every year  Bone Density status: Completed 09/09/19. Results reflect: Bone  density results: OSTEOPENIA. Repeat every 2 years.  Lung Cancer Screening: (Low Dose CT Chest recommended if Age 47-80 years, 30 pack-year currently smoking OR have quit w/in 15years.) does not qualify.   Lung Cancer Screening Referral: N/a  Additional Screening:  Hepatitis C Screening: does qualify; Completed 12/14/16  Vision Screening: Recommended annual ophthalmology exams for  early detection of glaucoma and other disorders of the eye. Is the patient up to date with their annual eye exam?  Yes  Who is the provider or what is the name of the office in which the patient attends annual eye exams? [facility] If pt is not established with a provider, would they like to be referred to a provider to establish care? No .   Dental Screening: Recommended annual dental exams for proper oral hygiene  Community Resource Referral / Chronic Care Management: CRR required this visit?  No   CCM required this visit?  No      Plan:     I have personally reviewed and noted the following in the patient's chart:   Medical and social history Use of alcohol, tobacco or illicit drugs  Current medications and supplements including opioid prescriptions. Patient is not currently taking opioid prescriptions. Functional ability and status Nutritional status Physical activity Advanced directives List of other physicians Hospitalizations, surgeries, and ER visits in previous 12 months Vitals Screenings to include cognitive, depression, and falls Referrals and appointments  In addition, I have reviewed and discussed with patient certain preventive protocols, quality metrics, and best practice recommendations. A written personalized care plan for preventive services as well as general preventive health recommendations were provided to patient.     Beatris Ship, [place]   09/23/2021   Nurse Notes: None

## 2022-02-08 ENCOUNTER — Other Ambulatory Visit: Payer: Self-pay | Admitting: Family Medicine

## 2022-04-14 DIAGNOSIS — L820 Inflamed seborrheic keratosis: Secondary | ICD-10-CM | POA: Diagnosis not present

## 2022-04-24 NOTE — Progress Notes (Unsigned)
[facility] [address] [place], [place] [address] [phone]  Date:  04/27/2022   Name:  Misty Rivas   DOB:  1945-09-08   MRN:  [id number]  PCP:  Pearline Cables, MD    Chief Complaint: yearly OV (1. Weight. 2. Stiffness in the hands int the the MA. 3. Memory loss. 4. L great toe pain x 2 weeks. 5. R arm pain since covid shot. )   History of Present Illness:  Misty Rivas is a 77 y.o. very pleasant female patient who presents with the following:  Patient seen today for annual follow-up- history of hypertension, Nash, osteopenia, vitamin D deficiency  Most recent visit with myself about 1 year ago.  At that time she was planned to remarry (her first husband had died in May 26, 2019).  She has 2 adult sons and 4 grands  COVID-19 booster recommended Colon cancer screening due next year Mammogram completed in July DEXA scan-2021, can be updated- ordered for her today  Labs due today  Wt Readings from Last 3 Encounters:  04/27/22 168 lb 3.2 oz (76.3 kg)  09/23/21 165 lb 12.8 oz (75.2 kg)  05/05/21 163 lb 4.8 oz (74.1 kg)   2 weeks ago she had an attack of spontaneous left great toe pain  She has had this a few times over the last few years- c/w gout   She does check her BP at home ; tends to be well controlled  She notes concern about her memory- she notes some occasional memory problems, not to the point that she wants to see neurology as of yet Also notes difficulty losing weight which is frustrating to her Finally, she has noted pain in her right humerus off and on for about 6 month- seemed to start after she had a covid booster   Patient Active Problem List   Diagnosis Date Noted   Vitamin D deficiency 08/11/2020   Osteopenia 09/09/2019   NASH (nonalcoholic steatohepatitis) 04/18/2019   Essential hypertension 11/23/2016   Estrogen deficiency 11/23/2016    Past Medical History:  Diagnosis Date   Acid  reflux    Anxiety    Arthritis    Cataract    removed both eyes    Degenerative joint disease of knee    Depression    Diverticulosis    Fibromyalgia    Gastric polyp    GERD (gastroesophageal reflux disease)    Hiatal hernia    Hypertension    controlled     Past Surgical History:  Procedure Laterality Date   CESAREAN SECTION     x2   CHOLECYSTECTOMY     COLONOSCOPY     POLYPECTOMY     TONSILLECTOMY  1955   TOTAL KNEE ARTHROPLASTY Left 06/19/2017   UPPER GASTROINTESTINAL ENDOSCOPY      Social History   Tobacco Use   Smoking status: Never   Smokeless tobacco: Never  Vaping Use   Vaping Use: Never used  Substance Use Topics   Alcohol use: Yes    Comment: occ   Drug use: No    Family History  Problem Relation Age of Onset   Mental illness Mother    Heart disease Father    Prostate cancer Father    Breast cancer Sister    Colon cancer Neg Hx    Stomach cancer Neg Hx    Rectal cancer Neg Hx    Esophageal cancer Neg Hx  Liver cancer Neg Hx    Colon polyps Neg Hx     Allergies  Allergen Reactions   Lisinopril Other (See Comments)    Per pt cannot tolerate medication makes her fatigued   Sulfa Antibiotics Other (See Comments) and Rash    Other reaction(s): Other (See Comments) Unknown reaction per pt, hasnt taken in a long time. Unknown reaction per pt, hasnt taken in a long time.     Medication list has been reviewed and updated.  Current Outpatient Medications on File Prior to Visit  Medication Sig Dispense Refill   amLODipine (NORVASC) 2.5 MG tablet Take 1 tablet (2.5 mg total) by mouth daily. 90 tablet 3   aspirin 81 MG EC tablet Take by mouth.     Calcium Carb-Cholecalciferol (CALCIUM-VITAMIN D) 500-200 MG-UNIT tablet Take by mouth.     Cholecalciferol (VITAMIN D3) 1.25 MG (50000 UT) CAPS Take 1 weekly for 12 weeks 12 capsule 0   escitalopram (LEXAPRO) 20 MG tablet TAKE ONE TABLET BY MOUTH ONE TIME DAILY 90 tablet 0   levocetirizine (XYZAL) 5  MG tablet TAKE 1 TABLET BY MOUTH EVERY DAY IN THE EVENING 30 tablet 0   metoprolol tartrate (LOPRESSOR) 25 MG tablet TAKE ONE TABLET BY MOUTH TWICE A DAY 180 tablet 3   Multiple Vitamin (MULTI-VITAMINS) TABS Take by mouth.     naproxen sodium (ALEVE) 220 MG tablet Take 220 mg by mouth as needed.      No current facility-administered medications on file prior to visit.    Review of Systems:  As per HPI- otherwise negative.   Physical Examination: Vitals:   04/27/22 1301  BP: 136/80  Pulse: 60  Resp: 18  Temp: 97.9 F (36.6 C)   Vitals:   04/27/22 1301  Weight: 168 lb 3.2 oz (76.3 kg)  Height:  (1.626 m)   Body mass index is 28.87 kg/m. Ideal Body Weight: Weight in (lb) to have BMI = 25: 145.3  GEN: no acute distress.  Mild overweight, looks well  HEENT: Atraumatic, Normocephalic.  Bilateral TM wnl, oropharynx normal.  PEERL,EOMI.   Ears and Nose: No external deformity. CV: RRR, No M/G/R. No JVD. No thrill. No extra heart sounds. PULM: CTA B, no wheezes, crackles, rhonchi. No retractions. No resp. distress. No accessory muscle use. ABD: S, NT, ND, +BS. No rebound. No HSM. EXTR: No c/c/e PSYCH: Normally interactive. Conversant.  OA joint changes in B hands No current sx of gout in her MCP joints    Assessment and Plan: Essential hypertension - Plan: CBC, Comprehensive metabolic panel, amLODipine (NORVASC) 2.5 MG tablet, metoprolol tartrate (LOPRESSOR) 25 MG tablet, CT CARDIAC SCORING (SELF PAY ONLY)  Vitamin D deficiency - Plan: VITAMIN D 25 Hydroxy (Vit-D Deficiency, Fractures)  NASH (nonalcoholic steatohepatitis) - Plan: Comprehensive metabolic panel  Screening for diabetes mellitus - Plan: Hemoglobin A1c  Thyroid disorder screening - Plan: TSH  Screening for deficiency anemia - Plan: CBC  Screening, lipid - Plan: Lipid panel  Gout involving toe of left foot, unspecified cause, unspecified chronicity - Plan: colchicine 0.6 MG tablet, Uric acid  Estrogen  deficiency - Plan: DG Bone Density  Pain of right upper arm - Plan: DG Humerus Right  Health maintenance discussed Will plan further follow- up pending labs. Gave colchicine to have on hand for presumed occasional gout  It was great to see you again today, I will be in touch with your labs Recommend COVID booster if not done in the last 6 to 9  months-however, we can also certainly get an x-ray of your right upper arm bone today.  Stop by imaging on the ground floor to take care of this- they can also schedule your bone density   It sounds like you are having an occasional attack of gout.  I will check a uric acid level for you today.  Use colchicine as directed in case of gout.  Let me know if this is not helping or if any other questions  As we discussed, you are a bit overweight but not enough that it should cause you any major health problems.  Keep up the good work with a relatively healthy diet and exercise.  I would suggest adding in strength training to your regimen which can help with balance, day-to-day function, and also increased your metabolic rate  For arthritis- topical diclofenac gel may be a good option for you  I also ordered at coronary calcium test for you today    Signed Abbe Amsterdam, MD  Addendum 4/18, received labs as below.  Message to patient  Results for orders placed or performed in visit on 04/27/22  CBC  Result Value Ref Range   WBC 7.1 4.0 - 10.5 K/uL   RBC 4.92 3.87 - 5.11 Mil/uL   Platelets 311.0 150.0 - 400.0 K/uL   Hemoglobin 14.3 12.0 - 15.0 g/dL   HCT 16.1 09.6 - 04.5 %   MCV 88.2 78.0 - 100.0 fl   MCHC 33.0 30.0 - 36.0 g/dL   RDW 40.9 81.1 - 91.4 %  Comprehensive metabolic panel  Result Value Ref Range   Sodium 141 135 - 145 mEq/L   Potassium 4.2 3.5 - 5.1 mEq/L   Chloride 106 96 - 112 mEq/L   CO2 26 19 - 32 mEq/L   Glucose, Bld 85 70 - 99 mg/dL   BUN 19 6 - 23 mg/dL   Creatinine, Ser 7.82 0.40 - 1.20 mg/dL   Total Bilirubin 0.7 0.2  - 1.2 mg/dL   Alkaline Phosphatase 75 39 - 117 U/L   AST 21 0 - 37 U/L   ALT 16 0 - 35 U/L   Total Protein 6.7 6.0 - 8.3 g/dL   Albumin 4.4 3.5 - 5.2 g/dL   GFR 95.62 >13.08 mL/min   Calcium 10.1 8.4 - 10.5 mg/dL  Hemoglobin M5H  Result Value Ref Range   Hgb A1c MFr Bld 5.5 4.6 - 6.5 %  Lipid panel  Result Value Ref Range   Cholesterol 202 (H) 0 - 200 mg/dL   Triglycerides 846.9 0.0 - 149.0 mg/dL   HDL 62.95 >28.41 mg/dL   VLDL 32.4 0.0 - 40.1 mg/dL   LDL Cholesterol 027 (H) 0 - 99 mg/dL   Total CHOL/HDL Ratio 4    NonHDL 146.46   TSH  Result Value Ref Range   TSH 1.85 0.35 - 5.50 uIU/mL  VITAMIN D 25 Hydroxy (Vit-D Deficiency, Fractures)  Result Value Ref Range   VITD 34.12 30.00 - 100.00 ng/mL  Uric acid  Result Value Ref Range   Uric Acid, Serum 7.2 (H) 2.4 - 7.0 mg/dL

## 2022-04-24 NOTE — Patient Instructions (Incomplete)
It was great to see you again today, I will be in touch with your labs Recommend COVID booster if not done in the last 6 to 9 months-however, we can also certainly get an x-ray of your right upper arm bone today.  Stop by imaging on the ground floor to take care of this- they can also schedule your bone density   It sounds like you are having an occasional attack of gout.  I will check a uric acid level for you today.  Use colchicine as directed in case of gout.  Let me know if this is not helping or if any other questions  As we discussed, you are a bit overweight but not enough that it should cause you any major health problems.  Keep up the good work with a relatively healthy diet and exercise.  I would suggest adding in strength training to your regimen which can help with balance, day-to-day function, and also increased your metabolic rate  For arthritis- topical diclofenac gel may be a good option for you  I also ordered at coronary calcium test for you today

## 2022-04-27 ENCOUNTER — Encounter: Payer: Self-pay | Admitting: Family Medicine

## 2022-04-27 ENCOUNTER — Ambulatory Visit (HOSPITAL_BASED_OUTPATIENT_CLINIC_OR_DEPARTMENT_OTHER)
Admission: RE | Admit: 2022-04-27 | Discharge: 2022-04-27 | Disposition: A | Discharge status: Home / Self Care | Payer: Medicare Other | Source: Ambulatory Visit | Attending: Family Medicine | Admitting: Family Medicine

## 2022-04-27 ENCOUNTER — Ambulatory Visit (INDEPENDENT_AMBULATORY_CARE_PROVIDER_SITE_OTHER): Payer: Medicare Other | Admitting: Family Medicine

## 2022-04-27 VITALS — BP 136/80 | HR 60 | Temp 97.9°F | Resp 18 | Ht 64.0 in | Wt 168.2 lb

## 2022-04-27 DIAGNOSIS — E559 Vitamin D deficiency, unspecified: Secondary | ICD-10-CM

## 2022-04-27 DIAGNOSIS — M109 Gout, unspecified: Secondary | ICD-10-CM

## 2022-04-27 DIAGNOSIS — K7581 Nonalcoholic steatohepatitis (NASH): Secondary | ICD-10-CM | POA: Diagnosis not present

## 2022-04-27 DIAGNOSIS — M79621 Pain in right upper arm: Secondary | ICD-10-CM

## 2022-04-27 DIAGNOSIS — E2839 Other primary ovarian failure: Secondary | ICD-10-CM | POA: Diagnosis not present

## 2022-04-27 DIAGNOSIS — Z1329 Encounter for screening for other suspected endocrine disorder: Secondary | ICD-10-CM

## 2022-04-27 DIAGNOSIS — Z131 Encounter for screening for diabetes mellitus: Secondary | ICD-10-CM

## 2022-04-27 DIAGNOSIS — Z130 Encounter for screening for diseases of the blood and blood-forming organs and certain disorders involving the immune mechanism: Secondary | ICD-10-CM

## 2022-04-27 DIAGNOSIS — I10 Essential (primary) hypertension: Secondary | ICD-10-CM | POA: Diagnosis not present

## 2022-04-27 DIAGNOSIS — Z13220 Encounter for screening for lipoid disorders: Secondary | ICD-10-CM

## 2022-04-27 MED ORDER — ESCITALOPRAM OXALATE 20 MG PO TABS: 20.0000 mg | ORAL_TABLET | Freq: Every day | ORAL | 3 refills | Status: DC

## 2022-04-27 MED ORDER — METOPROLOL TARTRATE 25 MG PO TABS: 25.0000 mg | ORAL_TABLET | Freq: Two times a day (BID) | ORAL | 3 refills | Status: DC

## 2022-04-27 MED ORDER — COLCHICINE 0.6 MG PO TABS
ORAL_TABLET | ORAL | 0 refills | Status: AC
Start: 2022-04-27 — End: ?

## 2022-04-27 MED ORDER — AMLODIPINE BESYLATE 2.5 MG PO TABS: 2.5000 mg | ORAL_TABLET | Freq: Every day | ORAL | 3 refills | Status: DC

## 2022-04-28 ENCOUNTER — Encounter: Payer: Self-pay | Admitting: Family Medicine

## 2022-04-28 LAB — COMPREHENSIVE METABOLIC PANEL
ALT: 16 U/L (ref 0–35)
AST: 21 U/L (ref 0–37)
Albumin: 4.4 g/dL (ref 3.5–5.2)
Alkaline Phosphatase: 75 U/L (ref 39–117)
BUN: 19 mg/dL (ref 6–23)
CO2: 26 mEq/L (ref 19–32)
Calcium: 10.1 mg/dL (ref 8.4–10.5)
Chloride: 106 mEq/L (ref 96–112)
Creatinine, Ser: 0.79 mg/dL (ref 0.40–1.20)
GFR: 73.19 mL/min (ref >60.00)
Glucose, Bld: 85 mg/dL (ref 70–99)
Potassium: 4.2 mEq/L (ref 3.5–5.1)
Sodium: 141 mEq/L (ref 135–145)
Total Bilirubin: 0.7 mg/dL (ref 0.2–1.2)
Total Protein: 6.7 g/dL (ref 6.0–8.3)

## 2022-04-28 LAB — URIC ACID: Uric Acid, Serum: 7.2 mg/dL — ABNORMAL HIGH (ref 2.4–7.0)

## 2022-04-28 LAB — LIPID PANEL
Cholesterol: 202 mg/dL — ABNORMAL HIGH (ref 0–200)
HDL: 55.2 mg/dL (ref >39.00)
LDL Cholesterol: 124 mg/dL — ABNORMAL HIGH (ref 0–99)
NonHDL: 146.46
Total CHOL/HDL Ratio: 4
Triglycerides: 110 mg/dL (ref 0.0–149.0)
VLDL: 22 mg/dL (ref 0.0–40.0)

## 2022-04-28 LAB — CBC
HCT: 43.4 % (ref 36.0–46.0)
Hemoglobin: 14.3 g/dL (ref 12.0–15.0)
MCHC: 33 g/dL (ref 30.0–36.0)
MCV: 88.2 fl (ref 78.0–100.0)
Platelets: 311 10*3/uL (ref 150.0–400.0)
RBC: 4.92 Mil/uL (ref 3.87–5.11)
RDW: 12.9 % (ref 11.5–15.5)
WBC: 7.1 10*3/uL (ref 4.0–10.5)

## 2022-04-28 LAB — HEMOGLOBIN A1C: Hgb A1c MFr Bld: 5.5 % (ref 4.6–6.5)

## 2022-04-28 LAB — VITAMIN D 25 HYDROXY (VIT D DEFICIENCY, FRACTURES): VITD: 34.12 ng/mL (ref 30.00–100.00)

## 2022-04-28 LAB — TSH: TSH: 1.85 u[IU]/mL (ref 0.35–5.50)

## 2022-04-28 NOTE — Telephone Encounter (Signed)
Pt can't get in to her mychart and wanted someone to call her instead.

## 2022-04-29 NOTE — Telephone Encounter (Signed)
Pt aware and voices understanding.   Pt also says she is having some urinary incontinence- she says she saw a commercial on TV for a doctor that could fix this and wanted to know your thoughts about this.   (Labs: she says she is scheduled for CT Calcium Score)

## 2022-04-29 NOTE — Telephone Encounter (Signed)
She did not. 

## 2022-05-02 NOTE — Telephone Encounter (Signed)
Called her back and LMOM- I am glad to try and help but need more details about the treatment she is wondering about, please send me a mychart

## 2022-05-16 ENCOUNTER — Ambulatory Visit (HOSPITAL_BASED_OUTPATIENT_CLINIC_OR_DEPARTMENT_OTHER)
Admission: RE | Admit: 2022-05-16 | Discharge: 2022-05-16 | Disposition: A | Discharge status: Home / Self Care | Payer: Medicare Other | Source: Ambulatory Visit | Attending: Family Medicine | Admitting: Family Medicine

## 2022-05-16 ENCOUNTER — Encounter: Payer: Self-pay | Admitting: Family Medicine

## 2022-05-16 DIAGNOSIS — Z780 Asymptomatic menopausal state: Secondary | ICD-10-CM | POA: Diagnosis not present

## 2022-05-16 DIAGNOSIS — E2839 Other primary ovarian failure: Secondary | ICD-10-CM | POA: Diagnosis not present

## 2022-05-16 DIAGNOSIS — I10 Essential (primary) hypertension: Secondary | ICD-10-CM

## 2022-05-16 DIAGNOSIS — M8589 Other specified disorders of bone density and structure, multiple sites: Secondary | ICD-10-CM | POA: Diagnosis not present

## 2022-05-17 ENCOUNTER — Other Ambulatory Visit (HOSPITAL_BASED_OUTPATIENT_CLINIC_OR_DEPARTMENT_OTHER): Payer: Medicare Other

## 2022-07-19 ENCOUNTER — Ambulatory Visit (INDEPENDENT_AMBULATORY_CARE_PROVIDER_SITE_OTHER): Payer: Medicare Other | Admitting: Family Medicine

## 2022-07-19 ENCOUNTER — Ambulatory Visit: Payer: Medicare Other | Admitting: Family Medicine

## 2022-07-19 ENCOUNTER — Encounter: Payer: Self-pay | Admitting: Family Medicine

## 2022-07-19 VITALS — BP 131/75 | HR 55 | Ht 64.0 in | Wt 166.0 lb

## 2022-07-19 DIAGNOSIS — M25511 Pain in right shoulder: Secondary | ICD-10-CM

## 2022-07-19 MED ORDER — MELOXICAM 15 MG PO TABS
15.0000 mg | ORAL_TABLET | Freq: Every day | ORAL | 0 refills | Status: DC
Start: 2022-07-19 — End: 2023-01-12

## 2022-07-19 NOTE — Patient Instructions (Signed)
Suspect rotator cuff tendinopathy: - rest, ice, heat, massage - home exercises (handout provided) - meloxicam daily for 2 weeks then as needed (do not mix with other NSAIDs like ibuprofen or Aleve; if you need something extra, you can take Tylenol) - Referral to sports medicine and physical therapy

## 2022-07-19 NOTE — Progress Notes (Signed)
Acute Office Visit  Subjective:     Patient ID: Misty Rivas, female    DOB: February 23, 1945, 77 y.o.   MRN: [id number]  Chief Complaint  Patient presents with   Arm Pain     Patient is in today for right shoulder pain.   Discussed the use of AI scribe software for clinical note transcription with the patient, who gave verbal consent to proceed.  History of Present Illness   The patient presents with a chief complaint of persistent right shoulder pain, which has been worsening over the course of several months. The pain is most significant in the upper arm, particularly in the deltoid region, and is exacerbated by movement, especially when raising the arm or dressing. The patient rates the pain as 7-8 out of 10 during movement. At rest, the patient does not notice the pain.  The patient has been managing the pain with over-the-counter medications, including Aleve and ibuprofen, which provide some relief. She denies any numbness or tingling in the hands.  The patient has had an x-ray of the humorous 2-3 months ago, which did not reveal any abnormalities in shoulder or elbow other than mild hypertrophic changes to right acromioclavicular joint. The patient has not taken any other specific treatments for the shoulder pain. The patient also reports difficulty sleeping on the affected side, which exacerbates the pain upon waking.             ROS All review of systems negative except what is listed in the HPI      Objective:    BP 131/75   Pulse (!) 55   Ht 5\' 4"  (1.626 m)   Wt 166 lb (75.3 kg)   SpO2 99%   BMI 28.49 kg/m    Physical Exam Musculoskeletal:        General: No swelling or tenderness.     Comments: Pain with Ananias Pilgrim, Empty Can, Apley scratch  Skin:    General: Skin is warm and dry.     Findings: No bruising or erythema.  Neurological:     General: No focal deficit present.     Mental Status: She is oriented to person, place, and time.   Psychiatric:        Mood and Affect: Mood normal.        Behavior: Behavior normal.        Thought Content: Thought content normal.        Judgment: Judgment normal.     No results found for any visits on 07/19/22.      Assessment & Plan:   Problem List Items Addressed This Visit   None Visit Diagnoses     Subacute pain of right shoulder    -  Primary Suspect rotator cuff tendinopathy: - rest, ice, heat, massage - home exercises (handout provided) - meloxicam daily for 2 weeks then as needed (do not mix with other NSAIDs like ibuprofen or Aleve; if you need something extra, you can take Tylenol) - Referral to sports medicine and physical therapy     Relevant Medications   meloxicam (MOBIC) 15 MG tablet   Other Relevant Orders   Ambulatory referral to Physical Therapy   Ambulatory referral to Sports Medicine       Meds ordered this encounter  Medications   meloxicam (MOBIC) 15 MG tablet    Sig: Take 1 tablet (15 mg total) by mouth daily.    Dispense:  30 tablet    Refill:  0  Order Specific Question:   Supervising Provider    Answer:   Bradd Canary [[id number]]    Return if symptoms worsen or fail to improve.  Clayborne Dana, NP

## 2022-08-18 ENCOUNTER — Ambulatory Visit: Payer: Medicare Other | Admitting: Physical Therapy

## 2022-09-01 ENCOUNTER — Ambulatory Visit: Payer: Medicare Other

## 2022-09-06 ENCOUNTER — Ambulatory Visit: Payer: Medicare Other | Attending: Family Medicine

## 2022-09-06 ENCOUNTER — Telehealth: Payer: Self-pay | Admitting: Neurology

## 2022-09-06 ENCOUNTER — Other Ambulatory Visit: Payer: Self-pay

## 2022-09-06 DIAGNOSIS — M12811 Other specific arthropathies, not elsewhere classified, right shoulder: Secondary | ICD-10-CM | POA: Diagnosis not present

## 2022-09-06 DIAGNOSIS — M25511 Pain in right shoulder: Secondary | ICD-10-CM | POA: Diagnosis not present

## 2022-09-06 NOTE — Telephone Encounter (Signed)
-----   Message from Clayborne Dana sent at 09/06/2022 12:45 PM EDT ----- Regarding: sports med/ortho I had a sports med referral placed so they could order advanced imaging, and it looks like they left 2 messages for her to schedule in July. I will get my CMA to reach out to her so she can call them back and get scheduled ASAP. Thank you for letting me know. ----- Message ----- From: Early Chars, PT Sent: 09/06/2022  10:35 AM EDT To: Clayborne Dana, NP  Thalia Party,   I evaluated Misty Rivas for physical therapy today. It has been about 6 weeks since you saw her in the office.  She was in tears, in significant resting  pain, very weak R shoulder, atrophied r deltoids, also had some fading bruising R lateral humerus. I would recommend that she see an orthopedist and/or obtain MRI for her shoulder if you agree, concerned about cuff tear /biceps tear.  She is to call our office back to come in for pain management techniques / exercise.  Thanks for this referral.   Amy

## 2022-09-06 NOTE — Telephone Encounter (Signed)
Called patient and she states she has not heard from anyone but did say that she is "gone a lot". Gave her the number to schedule with Sport's Medicine.

## 2022-09-06 NOTE — Therapy (Signed)
OUTPATIENT PHYSICAL THERAPY SHOULDER EVALUATION   Patient Name: Misty Rivas MRN: [id number] DOB:[birth year], 77 y.o., female Today's Date: 09/06/2022  END OF SESSION:  PT End of Session - 09/06/22 1720     Visit Number 1    Date for PT Re-Evaluation 11/01/22    Progress Note Due on Visit 10    PT Start Time 0945    PT Stop Time 1015    PT Time Calculation (min) 30 min    Activity Tolerance Patient limited by pain    Behavior During Therapy [facility] for tasks assessed/performed             Past Medical History:  Diagnosis Date   Acid reflux    Anxiety    Arthritis    Cataract    removed both eyes    Degenerative joint disease of knee    Depression    Diverticulosis    Fibromyalgia    Gastric polyp    GERD (gastroesophageal reflux disease)    Hiatal hernia    Hypertension    controlled    Past Surgical History:  Procedure Laterality Date   CESAREAN SECTION     x2   CHOLECYSTECTOMY     COLONOSCOPY     POLYPECTOMY     TONSILLECTOMY  1955   TOTAL KNEE ARTHROPLASTY Left 06/19/2017   UPPER GASTROINTESTINAL ENDOSCOPY     Patient Active Problem List   Diagnosis Date Noted   Vitamin D deficiency 08/11/2020   Osteopenia 09/09/2019   NASH (nonalcoholic steatohepatitis) 04/18/2019   Essential hypertension 11/23/2016   Estrogen deficiency 11/23/2016    PCP: Pearline Cables, MD  REFERRING PROVIDER: Hyman Hopes, NP  REFERRING DIAG: acute pain R shoulder  THERAPY DIAG:  Acute pain of right shoulder  Rotator cuff arthropathy of right shoulder  Rationale for Evaluation and Treatment: Rehabilitation  ONSET DATE: mid June 2024  SUBJECTIVE:                                                                                                                                                                                      SUBJECTIVE STATEMENT: The patient reports pulled a muscle in her R shoulder when trying to garden, digging up and pulling up ferns.  Felt a pop,  has been in pain since then Hand dominance: Right  PERTINENT HISTORY: Mid June, was digging up ferns, pulled something in R arm. Has been hurting ever since, felt something give  PAIN:  Are you having pain? Yes: NPRS scale: 7/10 Pain location: R lateral upper arm, radiating into cubital fossa Pain description: constant pain, worse with any movement, and in attempting to sleep Aggravating factors:  movement, lifting Relieving factors: rest  PRECAUTIONS: None  RED FLAGS: None   WEIGHT BEARING RESTRICTIONS: No  FALLS:  Has patient fallen in last 6 months? No  LIVING ENVIRONMENT: Lives with: lives with their spouse Lives in: House/apartment Stairs: Yes: Internal: 11 steps; on right going up Has following equipment at home: None  OCCUPATION: retired  PLOF: Independent  PATIENT GOALS:no pain R shoulder, regain strength  NEXT MD VISIT: unclear  OBJECTIVE:   DIAGNOSTIC FINDINGS:  X rays R humerus wnl  PATIENT SURVEYS:  Quick Dash 79.5% disability  COGNITION: Overall cognitive status: Within functional limits for tasks assessed     SENSATION: WFL  POSTURE: Atrophy noted R shoulder deltoids, sloped posture r shoulder, faded bruising R lateral deltoid attachment  UPPER EXTREMITY ROM:   Passive ROM Right eval Left eval  Shoulder flexion 80   Shoulder extension    Shoulder abduction 60   Shoulder adduction    Shoulder internal rotation    Shoulder external rotation 56   Elbow flexion    Elbow extension    Wrist flexion    Wrist extension    Wrist ulnar deviation    Wrist radial deviation    Wrist pronation    Wrist supination    (Blank rows = wfl)  UPPER EXTREMITY MMT:  MMT Right eval Left eval  Shoulder flexion 2+   Shoulder extension    Shoulder abduction 2+   Shoulder adduction    Shoulder internal rotation 2-   Shoulder external rotation 2-   Middle trapezius    Lower trapezius    Elbow flexion 3   Elbow extension 3   Wrist flexion     Wrist extension    Wrist ulnar deviation    Wrist radial deviation    Wrist pronation    Wrist supination    Grip strength (lbs)    (Blank rows = NT)    JOINT MOBILITY TESTING:  Patient unable to tolerate  PALPATION:  Muscle belly R biceps and attachment at short head, also supraspinatus quite painful   TODAY'S TREATMENT:                                                                                                                                         DATE: 09/06/22: Abbreviated session as pt 15 min late Did utilize kinesiotape, 2 I pieces extending from mid deltoids to upper traps and lateral deltoids to upper traps to provide more stability R shoulder   PATIENT EDUCATION: Education details: POC, goals Person educated: Patient Education method: Programmer, multimedia, Demonstration, Tactile cues, and Verbal cues Education comprehension: verbalized understanding  HOME EXERCISE PROGRAM: na  ASSESSMENT:  CLINICAL IMPRESSION: Patient is a 77 y.o. female who was seen today for physical therapy evaluation and treatment for R shoulder injury, which is now around 10 weeks ago.  Today she was quite anxious, demonstrated atrophy R shoulder deltoid musculature  and weakness R shoulder in all directions of movement.  Also poor tolerance and reduced ROM R shoulder.  Did communicate with referring provider regarding probable need for orthopedic referral or MRI, and apparently attempts to contact this pt to schedule have been attempted and she has not responded to these messages.  Would recommend hold PT at this time and she is going to call to reschedule but wishes to obtain further imaging first.  Therefore will hold her chart for 30 days and will reassess and establish goals if she returns  OBJECTIVE IMPAIRMENTS: decreased ROM, decreased strength, impaired flexibility, impaired UE functional use, and pain.   ACTIVITY LIMITATIONS: carrying, lifting, bathing, dressing, reach over head, and  hygiene/grooming  PARTICIPATION LIMITATIONS: meal prep, cleaning, laundry, driving, shopping, and yard work  PERSONAL FACTORS: Behavior pattern, Fitness, Past/current experiences, and Sex are also affecting patient's functional outcome.   REHAB POTENTIAL: Fair    CLINICAL DECISION MAKING: Stable/uncomplicated  EVALUATION COMPLEXITY: Low   GOALS: Goals reviewed with patient? No  PLAN:  PT FREQUENCY:  hold 30 days and reassess if she returns for appt  PT DURATION: other: see above  PLANNED INTERVENTIONS: Therapeutic exercises, Therapeutic activity, Neuromuscular re-education, Balance training, Gait training, Patient/Family education, Self Care, and Joint mobilization  PLAN FOR NEXT SESSION: will reassess and set goals after pt obtains MRI    Jahquan Klugh L Clemie General, PT, DPT, OCS 09/06/2022, 5:45 PM

## 2022-09-14 DIAGNOSIS — Z961 Presence of intraocular lens: Secondary | ICD-10-CM | POA: Diagnosis not present

## 2022-09-14 DIAGNOSIS — H43813 Vitreous degeneration, bilateral: Secondary | ICD-10-CM | POA: Diagnosis not present

## 2022-09-14 DIAGNOSIS — H5203 Hypermetropia, bilateral: Secondary | ICD-10-CM | POA: Diagnosis not present

## 2022-09-15 NOTE — Addendum Note (Signed)
Addended byMaxcine Ham, Noga Fogg L on: 09/15/2022 05:01 PM   Modules accepted: Orders

## 2022-09-16 DIAGNOSIS — M7551 Bursitis of right shoulder: Secondary | ICD-10-CM | POA: Diagnosis not present

## 2022-09-16 DIAGNOSIS — M19011 Primary osteoarthritis, right shoulder: Secondary | ICD-10-CM | POA: Diagnosis not present

## 2022-09-16 DIAGNOSIS — M25511 Pain in right shoulder: Secondary | ICD-10-CM | POA: Diagnosis not present

## 2022-09-16 DIAGNOSIS — M75101 Unspecified rotator cuff tear or rupture of right shoulder, not specified as traumatic: Secondary | ICD-10-CM | POA: Diagnosis not present

## 2022-09-16 DIAGNOSIS — M5030 Other cervical disc degeneration, unspecified cervical region: Secondary | ICD-10-CM | POA: Diagnosis not present

## 2022-09-16 DIAGNOSIS — M542 Cervicalgia: Secondary | ICD-10-CM | POA: Diagnosis not present

## 2022-09-26 DIAGNOSIS — M75101 Unspecified rotator cuff tear or rupture of right shoulder, not specified as traumatic: Secondary | ICD-10-CM | POA: Diagnosis not present

## 2022-09-26 DIAGNOSIS — M7551 Bursitis of right shoulder: Secondary | ICD-10-CM | POA: Diagnosis not present

## 2022-10-03 DIAGNOSIS — M7551 Bursitis of right shoulder: Secondary | ICD-10-CM | POA: Diagnosis not present

## 2022-10-03 DIAGNOSIS — M75101 Unspecified rotator cuff tear or rupture of right shoulder, not specified as traumatic: Secondary | ICD-10-CM | POA: Diagnosis not present

## 2022-10-05 DIAGNOSIS — G8929 Other chronic pain: Secondary | ICD-10-CM | POA: Diagnosis not present

## 2022-10-05 DIAGNOSIS — M25511 Pain in right shoulder: Secondary | ICD-10-CM | POA: Diagnosis not present

## 2022-10-05 DIAGNOSIS — M75101 Unspecified rotator cuff tear or rupture of right shoulder, not specified as traumatic: Secondary | ICD-10-CM | POA: Diagnosis not present

## 2022-10-05 DIAGNOSIS — M542 Cervicalgia: Secondary | ICD-10-CM | POA: Diagnosis not present

## 2022-10-05 DIAGNOSIS — M25611 Stiffness of right shoulder, not elsewhere classified: Secondary | ICD-10-CM | POA: Diagnosis not present

## 2022-10-12 DIAGNOSIS — G8929 Other chronic pain: Secondary | ICD-10-CM | POA: Diagnosis not present

## 2022-10-12 DIAGNOSIS — M25611 Stiffness of right shoulder, not elsewhere classified: Secondary | ICD-10-CM | POA: Diagnosis not present

## 2022-10-12 DIAGNOSIS — M542 Cervicalgia: Secondary | ICD-10-CM | POA: Diagnosis not present

## 2022-10-12 DIAGNOSIS — M25511 Pain in right shoulder: Secondary | ICD-10-CM | POA: Diagnosis not present

## 2022-10-12 DIAGNOSIS — M75101 Unspecified rotator cuff tear or rupture of right shoulder, not specified as traumatic: Secondary | ICD-10-CM | POA: Diagnosis not present

## 2022-10-17 DIAGNOSIS — G8929 Other chronic pain: Secondary | ICD-10-CM | POA: Diagnosis not present

## 2022-10-17 DIAGNOSIS — M542 Cervicalgia: Secondary | ICD-10-CM | POA: Diagnosis not present

## 2022-10-17 DIAGNOSIS — M25611 Stiffness of right shoulder, not elsewhere classified: Secondary | ICD-10-CM | POA: Diagnosis not present

## 2022-10-17 DIAGNOSIS — M75101 Unspecified rotator cuff tear or rupture of right shoulder, not specified as traumatic: Secondary | ICD-10-CM | POA: Diagnosis not present

## 2022-10-17 DIAGNOSIS — M25511 Pain in right shoulder: Secondary | ICD-10-CM | POA: Diagnosis not present

## 2022-10-21 ENCOUNTER — Telehealth: Payer: Self-pay | Admitting: Family Medicine

## 2022-10-21 NOTE — Telephone Encounter (Signed)
Initial Comment Caller states she has been experiencing dizziness and headaches the last few days. Translation No Disp. Time Lamount Cohen Time) Disposition Final User 10/21/2022 10:03:13 AM Attempt made - message left Capitanio, RN, Brandi 10/21/2022 10:20:39 AM Attempt made - no message left Nevin Bloodgood, RN, Brandi 10/21/2022 10:33:26 AM Attempt made - line busy Capitanio, RN, Brandi 10/21/2022 10:33:48 AM Attempt made - line busy Capitanio, RN, Brandi 10/21/2022 10:34:29 AM FINAL ATTEMPT MADE - message left Yes Nevin Bloodgood, RN, Brandi Final Disposition 10/21/2022 10:34:29 AM FINAL ATTEMPT MADE - message left Yes Capitanio, RN, Merry Proud

## 2022-10-21 NOTE — Telephone Encounter (Signed)
Pt called and stated that she has been experiencing dizziness and headaches for 2 days. I transferred her to be evaluated with a triage nurse.

## 2022-10-24 DIAGNOSIS — M542 Cervicalgia: Secondary | ICD-10-CM | POA: Diagnosis not present

## 2022-10-24 DIAGNOSIS — M75101 Unspecified rotator cuff tear or rupture of right shoulder, not specified as traumatic: Secondary | ICD-10-CM | POA: Diagnosis not present

## 2022-10-24 DIAGNOSIS — M25511 Pain in right shoulder: Secondary | ICD-10-CM | POA: Diagnosis not present

## 2022-10-24 DIAGNOSIS — G8929 Other chronic pain: Secondary | ICD-10-CM | POA: Diagnosis not present

## 2022-10-24 DIAGNOSIS — M25611 Stiffness of right shoulder, not elsewhere classified: Secondary | ICD-10-CM | POA: Diagnosis not present

## 2022-10-26 DIAGNOSIS — M7501 Adhesive capsulitis of right shoulder: Secondary | ICD-10-CM | POA: Diagnosis not present

## 2022-10-26 DIAGNOSIS — M75101 Unspecified rotator cuff tear or rupture of right shoulder, not specified as traumatic: Secondary | ICD-10-CM | POA: Diagnosis not present

## 2022-10-26 DIAGNOSIS — M7551 Bursitis of right shoulder: Secondary | ICD-10-CM | POA: Diagnosis not present

## 2022-10-27 DIAGNOSIS — M25611 Stiffness of right shoulder, not elsewhere classified: Secondary | ICD-10-CM | POA: Diagnosis not present

## 2022-10-27 DIAGNOSIS — G8929 Other chronic pain: Secondary | ICD-10-CM | POA: Diagnosis not present

## 2022-10-27 DIAGNOSIS — M75101 Unspecified rotator cuff tear or rupture of right shoulder, not specified as traumatic: Secondary | ICD-10-CM | POA: Diagnosis not present

## 2022-10-27 DIAGNOSIS — M542 Cervicalgia: Secondary | ICD-10-CM | POA: Diagnosis not present

## 2022-10-27 DIAGNOSIS — M25511 Pain in right shoulder: Secondary | ICD-10-CM | POA: Diagnosis not present

## 2022-11-16 DIAGNOSIS — M25611 Stiffness of right shoulder, not elsewhere classified: Secondary | ICD-10-CM | POA: Diagnosis not present

## 2022-11-16 DIAGNOSIS — M75101 Unspecified rotator cuff tear or rupture of right shoulder, not specified as traumatic: Secondary | ICD-10-CM | POA: Diagnosis not present

## 2022-11-16 DIAGNOSIS — G8929 Other chronic pain: Secondary | ICD-10-CM | POA: Diagnosis not present

## 2022-11-16 DIAGNOSIS — M542 Cervicalgia: Secondary | ICD-10-CM | POA: Diagnosis not present

## 2022-11-16 DIAGNOSIS — M25511 Pain in right shoulder: Secondary | ICD-10-CM | POA: Diagnosis not present

## 2022-11-17 DIAGNOSIS — Z96652 Presence of left artificial knee joint: Secondary | ICD-10-CM | POA: Diagnosis not present

## 2022-11-17 DIAGNOSIS — Z471 Aftercare following joint replacement surgery: Secondary | ICD-10-CM | POA: Diagnosis not present

## 2022-11-21 DIAGNOSIS — M75101 Unspecified rotator cuff tear or rupture of right shoulder, not specified as traumatic: Secondary | ICD-10-CM | POA: Diagnosis not present

## 2022-11-21 DIAGNOSIS — M542 Cervicalgia: Secondary | ICD-10-CM | POA: Diagnosis not present

## 2022-11-21 DIAGNOSIS — M25611 Stiffness of right shoulder, not elsewhere classified: Secondary | ICD-10-CM | POA: Diagnosis not present

## 2022-11-21 DIAGNOSIS — G8929 Other chronic pain: Secondary | ICD-10-CM | POA: Diagnosis not present

## 2022-11-21 DIAGNOSIS — M25511 Pain in right shoulder: Secondary | ICD-10-CM | POA: Diagnosis not present

## 2022-11-28 DIAGNOSIS — G8929 Other chronic pain: Secondary | ICD-10-CM | POA: Diagnosis not present

## 2022-11-28 DIAGNOSIS — M542 Cervicalgia: Secondary | ICD-10-CM | POA: Diagnosis not present

## 2022-11-28 DIAGNOSIS — M25511 Pain in right shoulder: Secondary | ICD-10-CM | POA: Diagnosis not present

## 2022-11-28 DIAGNOSIS — M75101 Unspecified rotator cuff tear or rupture of right shoulder, not specified as traumatic: Secondary | ICD-10-CM | POA: Diagnosis not present

## 2022-11-28 DIAGNOSIS — M25611 Stiffness of right shoulder, not elsewhere classified: Secondary | ICD-10-CM | POA: Diagnosis not present

## 2022-12-13 DIAGNOSIS — M7501 Adhesive capsulitis of right shoulder: Secondary | ICD-10-CM | POA: Diagnosis not present

## 2023-01-10 NOTE — Progress Notes (Addendum)
 [facility] [place] [address] [place], [place] [address] [phone]  Date:  01/12/2023   Name:  Misty Rivas   DOB:  [birth year]   MRN:  [id number]  PCP:  Watt Harlene BROCKS, MD    Chief Complaint: Urinary Frequency (X about 1 year. No pain)   History of Present Illness:  Misty Rivas is a 77 y.o. very pleasant female patient who presents with the following:  Patient seen today with concern of urinary symptoms-  history of hypertension, Hollie, osteopenia, vitamin D  deficiency  Most recent visit with myself was in April 2024  Lab work in April showed a slightly increased uric acid level, she had had concern of gout symptoms We also obtained a coronary calcium in May 2024, score of 0  She notes urinary frequency and leakage for 6 months Not really pain, no hematuria She has both urge and stress incontinence  She is using a pad much of the time, will always use a pad if she is leaving the house No hematuria No fever or chills   History of c/s x2 and gallbladder but did not have a hysterectomy or any other pelvic surgery  She may get up to urinate once a night She will leak variaabe amounts of urine during the day  She notes she tends to feel very tired by the end of the day  Patient Active Problem List   Diagnosis Date Noted   Vitamin D  deficiency 08/11/2020   Osteopenia 09/09/2019   NASH (nonalcoholic steatohepatitis) 04/18/2019   Essential hypertension 11/23/2016   Estrogen deficiency 11/23/2016    Past Medical History:  Diagnosis Date   Acid reflux    Anxiety    Arthritis    Cataract    removed both eyes    Degenerative joint disease of knee    Depression    Diverticulosis    Fibromyalgia    Gastric polyp    GERD (gastroesophageal reflux disease)    Hiatal hernia    Hypertension    controlled     Past Surgical History:  Procedure Laterality Date   CESAREAN SECTION     x2   CHOLECYSTECTOMY      COLONOSCOPY     POLYPECTOMY     TONSILLECTOMY  1955   TOTAL KNEE ARTHROPLASTY Left 06/19/2017   UPPER GASTROINTESTINAL ENDOSCOPY      Social History   Tobacco Use   Smoking status: Never   Smokeless tobacco: Never  Vaping Use   Vaping status: Never Used  Substance Use Topics   Alcohol use: Yes    Comment: occ   Drug use: No    Family History  Problem Relation Age of Onset   Mental illness Mother    Heart disease Father    Prostate cancer Father    Breast cancer Sister    Colon cancer Neg Hx    Stomach cancer Neg Hx    Rectal cancer Neg Hx    Esophageal cancer Neg Hx    Liver cancer Neg Hx    Colon polyps Neg Hx     Allergies  Allergen Reactions   Lisinopril Other (See Comments)    Per pt cannot tolerate medication makes her fatigued   Sulfa Antibiotics Other (See Comments) and Rash    Other reaction(s): Other (See Comments) Unknown reaction per pt, hasnt taken in a long time. Unknown reaction per pt, hasnt taken in a long time.  Medication list has been reviewed and updated.  Current Outpatient Medications on File Prior to Visit  Medication Sig Dispense Refill   amLODipine  (NORVASC ) 2.5 MG tablet Take 1 tablet (2.5 mg total) by mouth daily. 90 tablet 3   aspirin  81 MG EC tablet Take by mouth.     Calcium Carb-Cholecalciferol (CALCIUM-VITAMIN D ) 500-200 MG-UNIT tablet Take by mouth.     Cholecalciferol (VITAMIN D3) 1.25 MG (50000 UT) CAPS Take 1 weekly for 12 weeks 12 capsule 0   colchicine  0.6 MG tablet Take 1.2 mg once, then 0.6 mg an hour later- use as needed for gout attack. No do repeat for 3 days 30 tablet 0   escitalopram  (LEXAPRO ) 20 MG tablet Take 1 tablet (20 mg total) by mouth daily. 90 tablet 3   levocetirizine (XYZAL ) 5 MG tablet TAKE 1 TABLET BY MOUTH EVERY DAY IN THE EVENING 30 tablet 0   metoprolol  tartrate (LOPRESSOR ) 25 MG tablet Take 1 tablet (25 mg total) by mouth 2 (two) times daily. 180 tablet 3   Multiple Vitamin (MULTI-VITAMINS)  TABS Take by mouth.     No current facility-administered medications on file prior to visit.    Review of Systems:  As per HPI- otherwise negative.   Physical Examination: Vitals:   01/12/23 1440  BP: 118/80  Pulse: 68  Resp: 18  Temp: 97.6 F (36.4 C)  SpO2: 98%   Vitals:   01/12/23 1440  Weight: 167 lb (75.8 kg)  Height: 5' 4 (1.626 m)   Body mass index is 28.67 kg/m. Ideal Body Weight: Weight in (lb) to have BMI = 25: 145.3  GEN: no acute distress.  Minimal overweight, looks well HEENT: Atraumatic, Normocephalic. Bilateral TM wnl, oropharynx normal.  PEERL,EOMI.   Ears and Nose: No external deformity. CV: RRR, No M/G/R. No JVD. No thrill. No extra heart sounds. PULM: CTA B, no wheezes, crackles, rhonchi. No retractions. No resp. distress. No accessory muscle use. ABD: S, NT, ND, +BS. No rebound. No HSM. EXTR: No c/c/e PSYCH: Normally interactive. Conversant.  Normal vulva, urethra appears normal although mildly inflamed.  Normal vagina for age, some atrophy  Results for orders placed or performed in visit on 01/12/23  POCT Urinalysis Dipstick   Collection Time: 01/12/23  2:49 PM  Result Value Ref Range   Color, UA yellow    Clarity, UA cloudy    Glucose, UA Negative Negative   Bilirubin, UA negative    Ketones, UA negative    Spec Grav, UA 1.010 1.010 - 1.025   Blood, UA negative    pH, UA 5.0 5.0 - 8.0   Protein, UA Negative Negative   Urobilinogen, UA 0.2 0.2 or 1.0 E.U./dL   Nitrite, UA negative    Leukocytes, UA Negative Negative   Appearance cloudy    Odor none     Assessment and Plan: Urinary frequency - Plan: POCT Urinalysis Dipstick, Urine Culture  Urge and stress incontinence - Plan: tolterodine  (DETROL  LA) 4 MG 24 hr capsule  Essential hypertension  Screening for diabetes mellitus - Plan: Hemoglobin A1c, Comprehensive metabolic panel  Fatigue, unspecified type - Plan: TSH, VITAMIN D  25 Hydroxy (Vit-D Deficiency, Fractures),  B12  Vitamin D  deficiency - Plan: VITAMIN D  25 Hydroxy (Vit-D Deficiency, Fractures)  Patient seen today with concern of urinary frequency, combination urge and stress incontinence.  Urinalysis appears benign, suspect she is having pelvic floor instability related incontinence.  Encouraged her to try Kegel exercises, prescribed Detrol  after confirming she has no  history of glaucoma She will let me know how this works for her Blood pressure is under good control She notes fatigue, lab work pending as above  Signed Harlene Schroeder, MD  Addendum 1/4, received labs as below.  Message to patient  Results for orders placed or performed in visit on 01/12/23  POCT Urinalysis Dipstick   Collection Time: 01/12/23  2:49 PM  Result Value Ref Range   Color, UA yellow    Clarity, UA cloudy    Glucose, UA Negative Negative   Bilirubin, UA negative    Ketones, UA negative    Spec Grav, UA 1.010 1.010 - 1.025   Blood, UA negative    pH, UA 5.0 5.0 - 8.0   Protein, UA Negative Negative   Urobilinogen, UA 0.2 0.2 or 1.0 E.U./dL   Nitrite, UA negative    Leukocytes, UA Negative Negative   Appearance cloudy    Odor none   Urine Culture   Collection Time: 01/12/23  3:19 PM   Specimen: Urine  Result Value Ref Range   MICRO NUMBER: [id number]    SPECIMEN QUALITY: Adequate    Sample Source URINE    STATUS: FINAL    Result: No Growth   Hemoglobin A1c   Collection Time: 01/12/23  3:19 PM  Result Value Ref Range   Hgb A1c MFr Bld 5.6 4.6 - 6.5 %  Comprehensive metabolic panel   Collection Time: 01/12/23  3:19 PM  Result Value Ref Range   Sodium 141 135 - 145 mEq/L   Potassium 4.0 3.5 - 5.1 mEq/L   Chloride 104 96 - 112 mEq/L   CO2 26 19 - 32 mEq/L   Glucose, Bld 87 70 - 99 mg/dL   BUN 16 6 - 23 mg/dL   Creatinine, Ser 9.25 0.40 - 1.20 mg/dL   Total Bilirubin 0.5 0.2 - 1.2 mg/dL   Alkaline Phosphatase 80 39 - 117 U/L   AST 20 0 - 37 U/L   ALT 16 0 - 35 U/L   Total Protein 6.8 6.0 - 8.3  g/dL   Albumin 4.5 3.5 - 5.2 g/dL   GFR 21.22 >39.99 mL/min   Calcium 10.5 8.4 - 10.5 mg/dL  TSH   Collection Time: 01/12/23  3:19 PM  Result Value Ref Range   TSH 2.31 0.35 - 5.50 uIU/mL  VITAMIN D  25 Hydroxy (Vit-D Deficiency, Fractures)   Collection Time: 01/12/23  3:19 PM  Result Value Ref Range   VITD 25.85 (L) 30.00 - 100.00 ng/mL  B12   Collection Time: 01/12/23  3:19 PM  Result Value Ref Range   Vitamin B-12 173 (L) 211 - 911 pg/mL

## 2023-01-12 ENCOUNTER — Ambulatory Visit (INDEPENDENT_AMBULATORY_CARE_PROVIDER_SITE_OTHER): Payer: Medicare Other | Admitting: Family Medicine

## 2023-01-12 VITALS — BP 118/80 | HR 68 | Temp 97.6°F | Resp 18 | Ht 64.0 in | Wt 167.0 lb

## 2023-01-12 DIAGNOSIS — I10 Essential (primary) hypertension: Secondary | ICD-10-CM | POA: Diagnosis not present

## 2023-01-12 DIAGNOSIS — R5383 Other fatigue: Secondary | ICD-10-CM | POA: Diagnosis not present

## 2023-01-12 DIAGNOSIS — E559 Vitamin D deficiency, unspecified: Secondary | ICD-10-CM

## 2023-01-12 DIAGNOSIS — Z131 Encounter for screening for diabetes mellitus: Secondary | ICD-10-CM

## 2023-01-12 DIAGNOSIS — N3946 Mixed incontinence: Secondary | ICD-10-CM

## 2023-01-12 DIAGNOSIS — R350 Frequency of micturition: Secondary | ICD-10-CM | POA: Diagnosis not present

## 2023-01-12 DIAGNOSIS — E538 Deficiency of other specified B group vitamins: Secondary | ICD-10-CM

## 2023-01-12 LAB — POCT URINALYSIS DIPSTICK
Bilirubin, UA: NEGATIVE
Blood, UA: NEGATIVE
Glucose, UA: NEGATIVE
Ketones, UA: NEGATIVE
Leukocytes, UA: NEGATIVE
Nitrite, UA: NEGATIVE
Protein, UA: NEGATIVE
Spec Grav, UA: 1.01 (ref 1.010–1.025)
Urobilinogen, UA: 0.2 U/dL
pH, UA: 5 (ref 5.0–8.0)

## 2023-01-12 MED ORDER — TOLTERODINE TARTRATE ER 4 MG PO CP24
4.0000 mg | ORAL_CAPSULE | Freq: Every day | ORAL | 2 refills | Status: AC
Start: 2023-01-12 — End: ?

## 2023-01-12 NOTE — Patient Instructions (Signed)
 Good to see you today- I will be in touch with your labs asap Try the detrol la for your urinary symptoms - let me know how it works for you Try to do 100 kegal "squeezes" daily

## 2023-01-13 LAB — COMPREHENSIVE METABOLIC PANEL
ALT: 16 U/L (ref 0–35)
AST: 20 U/L (ref 0–37)
Albumin: 4.5 g/dL (ref 3.5–5.2)
Alkaline Phosphatase: 80 U/L (ref 39–117)
BUN: 16 mg/dL (ref 6–23)
CO2: 26 meq/L (ref 19–32)
Calcium: 10.5 mg/dL (ref 8.4–10.5)
Chloride: 104 meq/L (ref 96–112)
Creatinine, Ser: 0.74 mg/dL (ref 0.40–1.20)
GFR: 78.77 mL/min (ref >60.00)
Glucose, Bld: 87 mg/dL (ref 70–99)
Potassium: 4 meq/L (ref 3.5–5.1)
Sodium: 141 meq/L (ref 135–145)
Total Bilirubin: 0.5 mg/dL (ref 0.2–1.2)
Total Protein: 6.8 g/dL (ref 6.0–8.3)

## 2023-01-13 LAB — VITAMIN D 25 HYDROXY (VIT D DEFICIENCY, FRACTURES): VITD: 25.85 ng/mL — ABNORMAL LOW (ref 30.00–100.00)

## 2023-01-13 LAB — URINE CULTURE
MICRO NUMBER:: 15910161
Result:: NO GROWTH
SPECIMEN QUALITY:: ADEQUATE

## 2023-01-13 LAB — TSH: TSH: 2.31 u[IU]/mL (ref 0.35–5.50)

## 2023-01-13 LAB — HEMOGLOBIN A1C: Hgb A1c MFr Bld: 5.6 % (ref 4.6–6.5)

## 2023-01-13 LAB — VITAMIN B12: Vitamin B-12: 173 pg/mL — ABNORMAL LOW (ref 211–911)

## 2023-01-14 ENCOUNTER — Encounter: Payer: Self-pay | Admitting: Family Medicine

## 2023-01-14 NOTE — Addendum Note (Signed)
 Addended by: Abbe Amsterdam C on: 01/14/2023 06:25 AM   Modules accepted: Orders

## 2023-01-30 DIAGNOSIS — H00015 Hordeolum externum left lower eyelid: Secondary | ICD-10-CM | POA: Diagnosis not present

## 2023-02-21 ENCOUNTER — Telehealth: Payer: Self-pay

## 2023-02-21 ENCOUNTER — Encounter: Payer: Self-pay | Admitting: Family Medicine

## 2023-02-21 NOTE — Telephone Encounter (Signed)
Copied from CRM [id number]. Topic: Clinical - Prescription Issue >> Feb 21, 2023 11:22 AM Truddie Crumble wrote: Reason for CRM: patient called stating the tolterodine is not covered by her insurance. Patient would like to know if she can get a medication that is coverd

## 2023-04-20 ENCOUNTER — Encounter: Payer: Self-pay | Admitting: Family Medicine

## 2023-04-20 ENCOUNTER — Ambulatory Visit (INDEPENDENT_AMBULATORY_CARE_PROVIDER_SITE_OTHER): Admitting: Family Medicine

## 2023-04-20 ENCOUNTER — Ambulatory Visit (HOSPITAL_BASED_OUTPATIENT_CLINIC_OR_DEPARTMENT_OTHER)
Admission: RE | Admit: 2023-04-20 | Discharge: 2023-04-20 | Disposition: A | Discharge status: Home / Self Care | Source: Ambulatory Visit | Attending: Family Medicine | Admitting: Family Medicine

## 2023-04-20 VITALS — BP 159/87 | HR 60 | Ht 64.0 in | Wt 175.0 lb

## 2023-04-20 DIAGNOSIS — R1032 Left lower quadrant pain: Secondary | ICD-10-CM | POA: Diagnosis not present

## 2023-04-20 DIAGNOSIS — K219 Gastro-esophageal reflux disease without esophagitis: Secondary | ICD-10-CM | POA: Diagnosis not present

## 2023-04-20 DIAGNOSIS — K4090 Unilateral inguinal hernia, without obstruction or gangrene, not specified as recurrent: Secondary | ICD-10-CM | POA: Diagnosis not present

## 2023-04-20 DIAGNOSIS — K429 Umbilical hernia without obstruction or gangrene: Secondary | ICD-10-CM | POA: Diagnosis not present

## 2023-04-20 DIAGNOSIS — K5730 Diverticulosis of large intestine without perforation or abscess without bleeding: Secondary | ICD-10-CM | POA: Diagnosis not present

## 2023-04-20 LAB — COMPREHENSIVE METABOLIC PANEL WITH GFR
AG Ratio: 2 (calc) (ref 1.0–2.5)
ALT: 16 U/L (ref 6–29)
AST: 22 U/L (ref 10–35)
Albumin: 4.5 g/dL (ref 3.6–5.1)
Alkaline phosphatase (APISO): 68 U/L (ref 37–153)
BUN: 14 mg/dL (ref 7–25)
CO2: 25 mmol/L (ref 20–32)
Calcium: 10 mg/dL (ref 8.6–10.4)
Chloride: 105 mmol/L (ref 98–110)
Creat: 0.78 mg/dL (ref 0.60–1.00)
Globulin: 2.3 g/dL (ref 1.9–3.7)
Glucose, Bld: 84 mg/dL (ref 65–99)
Potassium: 3.9 mmol/L (ref 3.5–5.3)
Sodium: 139 mmol/L (ref 135–146)
Total Bilirubin: 0.7 mg/dL (ref 0.2–1.2)
Total Protein: 6.8 g/dL (ref 6.1–8.1)
eGFR: 79 mL/min/{1.73_m2} (ref >=60)

## 2023-04-20 LAB — POC URINALSYSI DIPSTICK (AUTOMATED)
Bilirubin, UA: NEGATIVE
Blood, UA: NEGATIVE
Glucose, UA: NEGATIVE
Ketones, UA: NEGATIVE
Nitrite, UA: NEGATIVE
Protein, UA: NEGATIVE
Spec Grav, UA: 1.01 (ref 1.010–1.025)
Urobilinogen, UA: 0.2 U/dL
pH, UA: 6 (ref 5.0–8.0)

## 2023-04-20 LAB — CBC WITH DIFFERENTIAL/PLATELET
Absolute Lymphocytes: 2297 {cells}/uL (ref 850–3900)
Absolute Monocytes: 619 {cells}/uL (ref 200–950)
Basophils Absolute: 72 {cells}/uL (ref 0–200)
Basophils Relative: 1 %
Eosinophils Absolute: 151 {cells}/uL (ref 15–500)
Eosinophils Relative: 2.1 %
HCT: 41.8 % (ref 35.0–45.0)
Hemoglobin: 13.9 g/dL (ref 11.7–15.5)
MCH: 28.6 pg (ref 27.0–33.0)
MCHC: 33.3 g/dL (ref 32.0–36.0)
MCV: 86 fL (ref 80.0–100.0)
MPV: 9.9 fL (ref 7.5–12.5)
Monocytes Relative: 8.6 %
Neutro Abs: 4061 {cells}/uL (ref 1500–7800)
Neutrophils Relative %: 56.4 %
Platelets: 295 10*3/uL (ref 140–400)
RBC: 4.86 10*6/uL (ref 3.80–5.10)
RDW: 12.2 % (ref 11.0–15.0)
Total Lymphocyte: 31.9 %
WBC: 7.2 10*3/uL (ref 3.8–10.8)

## 2023-04-20 MED ORDER — IOHEXOL 300 MG/ML  SOLN
100.0000 mL | Freq: Once | INTRAMUSCULAR | Status: AC | PRN
  Administered 2023-04-20: 100 mL via INTRAVENOUS

## 2023-04-20 NOTE — Progress Notes (Signed)
 Acute Office Visit  Subjective:     Patient ID: Misty Rivas, female    DOB: 07/04/1945, 77 y.o.   MRN: [id number]  Chief Complaint  Patient presents with   Groin Pain     Patient is in today for  LLQ pain.     Discussed the use of AI scribe software for clinical note transcription with the patient, who gave verbal consent to proceed.  History of Present Illness Misty Rivas is a 78 year old female who presents with left-sided abdominal pain and diarrhea.  She has been experiencing left-sided abdominal pain for approximately three weeks. The pain began while she was in the shower, after putting her leg up to shave but unsure if this is just coincidental. It is sometimes dull and sometimes more intense, particularly after eating or physical activity, and occasionally radiates towards her back. She rates the pain as noticeable but not horrible at this moment, and it has not affected her appetite.  Approximately one week after the onset of pain, she experienced explosive diarrhea, which recurred this past Sunday. Since then, her bowel movements have been more loose than usual, but without blood. No nausea, vomiting, fever, or skin changes.  She has a history of diverticula noted on a colonoscopy in 2015, with no problems at that time. Her mother had diverticulitis. She mentions a previous episode two months ago where she felt the need to have a bowel movement but did not, followed by nausea and vomiting, which has since resolved.  She reports urinary symptoms, including occasional leakage, which she describes as a normal occurrence for her.  She traveled from [place] to the appointment and had a stressful journey, which she attributes to her elevated blood pressure.       ROS All review of systems negative except what is listed in the HPI      Objective:    BP (!) 159/87   Pulse 60   Ht 5\' 4"  (1.626 m)   Wt 175 lb (79.4 kg)   SpO2 100%   BMI 30.04 kg/m     Physical Exam Vitals reviewed.  Constitutional:      Appearance: Normal appearance.  Cardiovascular:     Rate and Rhythm: Normal rate.  Pulmonary:     Effort: Pulmonary effort is normal.  Abdominal:     General: Abdomen is flat. Bowel sounds are normal. There is no distension.     Tenderness: There is abdominal tenderness in the left lower quadrant. There is no right CVA tenderness, left CVA tenderness or rebound.  Musculoskeletal:        General: Normal range of motion.  Skin:    General: Skin is warm and dry.  Neurological:     Mental Status: She is alert and oriented to person, place, and time.  Psychiatric:        Mood and Affect: Mood normal.        Behavior: Behavior normal.        Thought Content: Thought content normal.     Results for orders placed or performed in visit on 04/20/23  POCT Urinalysis Dipstick (Automated)  Result Value Ref Range   Color, UA yellow    Clarity, UA clear    Glucose, UA Negative Negative   Bilirubin, UA negative    Ketones, UA negative    Spec Grav, UA 1.010 1.010 - 1.025   Blood, UA negative    pH, UA 6.0 5.0 - 8.0   Protein, UA  Negative Negative   Urobilinogen, UA 0.2 0.2 or 1.0 E.U./dL   Nitrite, UA negative    Leukocytes, UA Trace (A) Negative        Assessment & Plan:   Problem List Items Addressed This Visit   None Visit Diagnoses       LLQ pain    -  Primary   Relevant Orders   CBC with Differential/Platelet   Comprehensive metabolic panel with GFR   POCT Urinalysis Dipstick (Automated) (Completed)   Urine Culture   CT ABDOMEN PELVIS W CONTRAST      Assessment & Plan Left lower quadrant abdominal pain Persistent left lower quadrant pain for three weeks, likely mild diverticulitis given pain location, presence of diverticula, and absence of fever. - Order CT scan of the abdomen to confirm diverticulitis. - Perform urinalysis to rule out urinary causes - trace leuks, will send for culture. - Labs today -  Advise clear liquid diet for 2-3 days. - Prescribe antibiotics if CT confirms diverticulitis. - Instruct to seek immediate care if symptoms worsen or if there is any bleeding.  Hypertension Elevated blood pressure at 159/87 mmHg, likely stress-related. Home monitoring needed to assess trends. - Advise home blood pressure monitoring once daily or every other day. - Schedule follow-up nurse visit in two weeks to recheck blood pressure.   No orders of the defined types were placed in this encounter.   Return in about 2 weeks (around 05/04/2023) for BP check with nurse or sooner if needed .  Clayborne Dana, NP

## 2023-04-20 NOTE — Addendum Note (Signed)
 Addended by: Thelma Barge D on: 04/20/2023 04:52 PM   Modules accepted: Orders

## 2023-04-21 ENCOUNTER — Encounter: Payer: Self-pay | Admitting: Family Medicine

## 2023-04-21 LAB — URINE CULTURE
MICRO NUMBER:: 16314084
Result:: NO GROWTH
SPECIMEN QUALITY:: ADEQUATE

## 2023-04-26 ENCOUNTER — Encounter: Payer: Self-pay | Admitting: Family Medicine

## 2023-04-26 ENCOUNTER — Telehealth: Payer: Self-pay | Admitting: Family Medicine

## 2023-04-26 NOTE — Telephone Encounter (Signed)
 Copied from CRM [id number]. Topic: Medicare AWV >> Apr 26, 2023  9:36 AM Juliana Ocean wrote: Reason for CRM: Called LVM 04/26/2023 to schedule AWV. Please schedule Virtual or Telehealth visits ONLY.   Rosalee Collins; Care Guide Ambulatory Clinical Support [place] [facility] Direct Dial: [phone]

## 2023-05-04 ENCOUNTER — Ambulatory Visit

## 2023-05-17 ENCOUNTER — Ambulatory Visit: Admitting: Family Medicine

## 2023-05-17 VITALS — BP 132/80 | HR 66

## 2023-05-17 DIAGNOSIS — I10 Essential (primary) hypertension: Secondary | ICD-10-CM

## 2023-05-17 NOTE — Progress Notes (Signed)
 BP Readings from Last 3 Encounters:  04/20/23 (!) 159/87  01/12/23 118/80  07/19/22 131/75    Pt here for Blood pressure check per Minna Amass  Pt currently takes: Amlodipine  2.5 mg Tablet and metoprolol  artrate 25 mg tablet Hypertension Elevated blood pressure at 159/87 mmHg, likely stress-related. Home monitoring needed to assess trends. - Advise home blood pressure monitoring once daily or every other day. - Schedule follow-up nurse visit in two weeks to recheck blood pressure.    Pt reports compliance with medication. No  BP today @ = 132/80 HR = 66  Pt advised per Dr Geralyn Knee take medication as directed and follow up in 1 month.

## 2023-06-09 ENCOUNTER — Telehealth: Payer: Self-pay | Admitting: Family Medicine

## 2023-06-09 NOTE — Telephone Encounter (Signed)
 Copied from CRM [id number]. Topic: Medicare AWV >> Jun 09, 2023  1:15 PM Juliana Ocean wrote: Reason for CRM: LVM 06/09/2023 to schedule AWV. Please schedule Virtual or Telehealth visits ONLY.   Rosalee Collins; Care Guide Ambulatory Clinical Support [place] [facility] Direct Dial: [phone]

## 2023-06-16 NOTE — Progress Notes (Signed)
 [facility] at [facility] [address] [place], [place] [address] [phone]  Date:  06/21/2023   Name:  Misty Rivas   DOB:  07-18-1945   MRN:  [id number]  PCP:  Kaylee Partridge, MD    Chief Complaint: Follow-up (Blood pressure its up and down )   History of Present Illness:  Misty Rivas is a 78 y.o. very pleasant female patient who presents with the following:  Patient seen today for follow-up visit.  She was seen most recently about 1 month ago for blood pressure follow-up-nurse visit only.  Blood pressure look good at that time with amlodipine  2.5 and metoprolol   Previous to that she was seen in April by my partner Minna Amass with left lower quadrant pain; this is only noted elevated blood pressure She checks her BP most days   BP Readings from Last 3 Encounters:  06/21/23 (!) 140/82  05/17/23 132/80  04/20/23 (!) 159/87   Pt notes her BP tends to run higher when she checks it on the right than on her left  Her home BP is running about 140/80  She also notes bilateral hip pain which is typically worse after she spends a lot of time gardening.  She has noted worsening of her hip pain for about a year.  She will use Aleve sometimes which is somewhat helpful Patient Active Problem List   Diagnosis Date Noted   Vitamin D  deficiency 08/11/2020   Osteopenia 09/09/2019   NASH (nonalcoholic steatohepatitis) 04/18/2019   Essential hypertension 11/23/2016   Estrogen deficiency 11/23/2016    Past Medical History:  Diagnosis Date   Acid reflux    Anxiety    Arthritis    Cataract    removed both eyes    Degenerative joint disease of knee    Depression    Diverticulosis    Fibromyalgia    Gastric polyp    GERD (gastroesophageal reflux disease)    Hiatal hernia    Hypertension    controlled     Past Surgical History:  Procedure Laterality Date   CESAREAN SECTION     x2   CHOLECYSTECTOMY     COLONOSCOPY      POLYPECTOMY     TONSILLECTOMY  1955   TOTAL KNEE ARTHROPLASTY Left 06/19/2017   UPPER GASTROINTESTINAL ENDOSCOPY      Social History   Tobacco Use   Smoking status: Never   Smokeless tobacco: Never  Vaping Use   Vaping status: Never Used  Substance Use Topics   Alcohol use: Yes    Comment: occ   Drug use: No    Family History  Problem Relation Age of Onset   Mental illness Mother    Heart disease Father    Prostate cancer Father    Breast cancer Sister    Colon cancer Neg Hx    Stomach cancer Neg Hx    Rectal cancer Neg Hx    Esophageal cancer Neg Hx    Liver cancer Neg Hx    Colon polyps Neg Hx     Allergies  Allergen Reactions   Lisinopril Other (See Comments)    Per pt cannot tolerate medication makes her fatigued   Sulfa Antibiotics Other (See Comments) and Rash    Other reaction(s): Other (See Comments) Unknown reaction per pt, hasnt taken in a long time. Unknown reaction per pt, hasnt taken in a long time.     Medication list has  been reviewed and updated.  Current Outpatient Medications on File Prior to Visit  Medication Sig Dispense Refill   aspirin  81 MG EC tablet Take by mouth.     Calcium Carb-Cholecalciferol (CALCIUM-VITAMIN D ) 500-200 MG-UNIT tablet Take by mouth.     Cholecalciferol (VITAMIN D3) 1.25 MG (50000 UT) CAPS Take 1 weekly for 12 weeks 12 capsule 0   colchicine  0.6 MG tablet Take 1.2 mg once, then 0.6 mg an hour later- use as needed for gout attack. No do repeat for 3 days 30 tablet 0   escitalopram  (LEXAPRO ) 20 MG tablet Take 1 tablet (20 mg total) by mouth daily. 90 tablet 3   levocetirizine (XYZAL ) 5 MG tablet TAKE 1 TABLET BY MOUTH EVERY DAY IN THE EVENING 30 tablet 0   metoprolol  tartrate (LOPRESSOR ) 25 MG tablet Take 1 tablet (25 mg total) by mouth 2 (two) times daily. 180 tablet 3   Multiple Vitamin (MULTI-VITAMINS) TABS Take by mouth.     tolterodine  (DETROL  LA) 4 MG 24 hr capsule Take 1 capsule (4 mg total) by mouth daily. 30  capsule 2   No current facility-administered medications on file prior to visit.    Review of Systems:  As per HPI- otherwise negative.   Physical Examination: Vitals:   06/21/23 0945  BP: (!) 140/82  Pulse: 61  SpO2: 97%   Vitals:   06/21/23 0945  Weight: 171 lb 12.8 oz (77.9 kg)  Height: 5' 4 (1.626 m)   Body mass index is 29.49 kg/m. Ideal Body Weight: Weight in (lb) to have BMI = 25: 145.3  GEN: no acute distress.  Overweight, looks well HEENT: Atraumatic, Normocephalic.  Ears and Nose: No external deformity. CV: RRR, No M/G/R. No JVD. No thrill. No extra heart sounds. PULM: CTA B, no wheezes, crackles, rhonchi. No retractions. No resp. distress. No accessory muscle use. ABD: S, NT, ND, +BS. No rebound. No HSM. EXTR: No c/c/e PSYCH: Normally interactive. Conversant.  Normal range of motion both hips   Assessment and Plan: Bilateral hip pain - Plan: DG Hip Unilat W OR W/O Pelvis 2-3 Views Right, DG Hip Unilat W OR W/O Pelvis 2-3 Views Left  Essential hypertension - Plan: amLODipine  (NORVASC ) 5 MG tablet  Good to see you today! Let's adjust your amlodipine  to 5 mg to bring down your BP a bit Please stop by x-ray to have films taken of your hips Ok to use tylenol  AND aleve as needed for hip pain    Assuming all is well please see me in about 6 months   - Will obtain hip films today.  Patient is not particularly eager to think about surgery, but if she has significant osteoarthritis she would be willing to consider it   Signed Gates Kasal, MD

## 2023-06-21 ENCOUNTER — Ambulatory Visit (INDEPENDENT_AMBULATORY_CARE_PROVIDER_SITE_OTHER): Admitting: Family Medicine

## 2023-06-21 ENCOUNTER — Ambulatory Visit (HOSPITAL_BASED_OUTPATIENT_CLINIC_OR_DEPARTMENT_OTHER)
Admission: RE | Admit: 2023-06-21 | Discharge: 2023-06-21 | Disposition: A | Discharge status: Home / Self Care | Source: Ambulatory Visit | Attending: Family Medicine | Admitting: Family Medicine

## 2023-06-21 ENCOUNTER — Encounter: Payer: Self-pay | Admitting: Family Medicine

## 2023-06-21 VITALS — BP 140/82 | HR 61 | Ht 64.0 in | Wt 171.8 lb

## 2023-06-21 DIAGNOSIS — M25552 Pain in left hip: Secondary | ICD-10-CM | POA: Insufficient documentation

## 2023-06-21 DIAGNOSIS — M25551 Pain in right hip: Secondary | ICD-10-CM | POA: Diagnosis not present

## 2023-06-21 DIAGNOSIS — I10 Essential (primary) hypertension: Secondary | ICD-10-CM | POA: Diagnosis not present

## 2023-06-21 DIAGNOSIS — M160 Bilateral primary osteoarthritis of hip: Secondary | ICD-10-CM | POA: Diagnosis not present

## 2023-06-21 MED ORDER — AMLODIPINE BESYLATE 5 MG PO TABS
5.0000 mg | ORAL_TABLET | Freq: Every day | ORAL | 3 refills | Status: AC
Start: 2023-06-21 — End: ?

## 2023-06-21 NOTE — Patient Instructions (Signed)
 Good to see you today! Let's adjust your amlodipine  to 5 mg to bring down your BP a bit Please stop by x-ray to have films taken of your hips Ok to use tylenol  AND aleve as needed for hip pain    Assuming all is well please see me in about 6 months

## 2023-06-25 ENCOUNTER — Encounter: Payer: Self-pay | Admitting: Family Medicine

## 2023-07-08 ENCOUNTER — Other Ambulatory Visit: Payer: Self-pay | Admitting: Family Medicine

## 2023-07-15 ENCOUNTER — Other Ambulatory Visit: Payer: Self-pay | Admitting: Family Medicine

## 2023-07-15 DIAGNOSIS — I10 Essential (primary) hypertension: Secondary | ICD-10-CM

## 2023-09-13 ENCOUNTER — Telehealth: Payer: Self-pay | Admitting: *Deleted

## 2023-09-13 ENCOUNTER — Ambulatory Visit

## 2023-09-13 NOTE — Telephone Encounter (Signed)
 I was running behind for appt today. No answer x 2. Left message to call to r/s. Sent my chart message as well.

## 2023-09-20 DIAGNOSIS — H5211 Myopia, right eye: Secondary | ICD-10-CM | POA: Diagnosis not present

## 2023-09-20 DIAGNOSIS — Z961 Presence of intraocular lens: Secondary | ICD-10-CM | POA: Diagnosis not present

## 2023-09-20 DIAGNOSIS — H43813 Vitreous degeneration, bilateral: Secondary | ICD-10-CM | POA: Diagnosis not present

## 2023-09-20 DIAGNOSIS — H5202 Hypermetropia, left eye: Secondary | ICD-10-CM | POA: Diagnosis not present

## 2023-09-20 DIAGNOSIS — H10413 Chronic giant papillary conjunctivitis, bilateral: Secondary | ICD-10-CM | POA: Diagnosis not present

## 2023-10-11 DIAGNOSIS — Z860100 Personal history of colon polyps, unspecified: Secondary | ICD-10-CM | POA: Diagnosis not present

## 2023-10-11 DIAGNOSIS — Z1211 Encounter for screening for malignant neoplasm of colon: Secondary | ICD-10-CM | POA: Diagnosis not present

## 2023-10-11 DIAGNOSIS — K640 First degree hemorrhoids: Secondary | ICD-10-CM | POA: Diagnosis not present

## 2023-10-11 DIAGNOSIS — K5730 Diverticulosis of large intestine without perforation or abscess without bleeding: Secondary | ICD-10-CM | POA: Diagnosis not present

## 2023-10-25 NOTE — Telephone Encounter (Signed)
Pt has not read mychart message. Mailed letter. 

## 2023-10-31 DIAGNOSIS — Z23 Encounter for immunization: Secondary | ICD-10-CM | POA: Diagnosis not present

## 2023-11-27 ENCOUNTER — Telehealth: Payer: Self-pay | Admitting: Family Medicine

## 2023-11-27 NOTE — Telephone Encounter (Signed)
 Copied from CRM #[id number]. Topic: Medicare AWV >> Nov 27, 2023  1:40 PM Nathanel DEL wrote: Called LVM 11/27/2023 to sched AWV. Please schedule in office or virtual visit.   Nathanel Paschal; Care Guide Ambulatory Clinical Support [place] [facility] Direct Dial: [phone]

## 2023-12-19 NOTE — Patient Instructions (Incomplete)
 It was good to see you today, I will be in touch with your labs Recommend 1 dose of RSV vaccine if not done already

## 2023-12-19 NOTE — Progress Notes (Unsigned)
 [facility] [address] [place], [place] [address] [phone]  Date:  12/21/2023   Name:  Misty Rivas   DOB:  1945/04/08   MRN:  [id number]  PCP:  Watt Harlene BROCKS, MD    Chief Complaint: No chief complaint on file.   History of Present Illness:  Misty Rivas is a 78 y.o. very pleasant female patient who presents with the following:  Patient seen today for periodic follow-up.  I saw her most recently in June History of essential hypertension, nonalcoholic steatohepatitis, vitamin D  deficiency, osteopenia At her last visit in June her blood pressure was a bit elevated on amlodipine  2.5, I increased her to 5 mg  She had a colonoscopy in October per Dr. [place]onia in 5 years Flu shot COVID booster She has completed Shingrix and pneumococcal vaccination Recommend 1 dose of RSV Mammogram May need update DEXA scan May 2024-osteopenia Lab work on chart from April-CMP and CBC only Can offer a liver FibroScan She did a CT coronary calcium 2024, score of 0  Lexapro  20 Metoprolol  25 twice daily Amlodipine  5 Aspirin  81  Discussed the use of AI scribe software for clinical note transcription with the patient, who gave verbal consent to proceed.  History of Present Illness      Patient Active Problem List   Diagnosis Date Noted   Vitamin D  deficiency 08/11/2020   Osteopenia 09/09/2019   NASH (nonalcoholic steatohepatitis) 04/18/2019   Essential hypertension 11/23/2016   Estrogen deficiency 11/23/2016    Past Medical History:  Diagnosis Date   Acid reflux    Anxiety    Arthritis    Cataract    removed both eyes    Degenerative joint disease of knee    Depression    Diverticulosis    Fibromyalgia    Gastric polyp    GERD (gastroesophageal reflux disease)    Hiatal hernia    Hypertension    controlled     Past Surgical History:  Procedure Laterality Date   CESAREAN SECTION     x2    CHOLECYSTECTOMY     COLONOSCOPY     POLYPECTOMY     TONSILLECTOMY  1955   TOTAL KNEE ARTHROPLASTY Left 06/19/2017   UPPER GASTROINTESTINAL ENDOSCOPY      Social History   Tobacco Use   Smoking status: Never   Smokeless tobacco: Never  Vaping Use   Vaping status: Never Used  Substance Use Topics   Alcohol use: Yes    Comment: occ   Drug use: No    Family History  Problem Relation Age of Onset   Mental illness Mother    Heart disease Father    Prostate cancer Father    Breast cancer Sister    Colon cancer Neg Hx    Stomach cancer Neg Hx    Rectal cancer Neg Hx    Esophageal cancer Neg Hx    Liver cancer Neg Hx    Colon polyps Neg Hx     Allergies  Allergen Reactions   Lisinopril Other (See Comments)    Per pt cannot tolerate medication makes her fatigued   Sulfa Antibiotics Other (See Comments) and Rash    Other reaction(s): Other (See Comments) Unknown reaction per pt, hasnt taken in a long time. Unknown reaction per pt, hasnt taken in a long time.     Medication list has been reviewed and updated.  Current Outpatient Medications on File Prior  to Visit  Medication Sig Dispense Refill   amLODipine  (NORVASC ) 5 MG tablet Take 1 tablet (5 mg total) by mouth daily. 90 tablet 3   aspirin  81 MG EC tablet Take by mouth.     Calcium Carb-Cholecalciferol (CALCIUM-VITAMIN D ) 500-200 MG-UNIT tablet Take by mouth.     Cholecalciferol (VITAMIN D3) 1.25 MG (50000 UT) CAPS Take 1 weekly for 12 weeks 12 capsule 0   colchicine  0.6 MG tablet Take 1.2 mg once, then 0.6 mg an hour later- use as needed for gout attack. No do repeat for 3 days 30 tablet 0   escitalopram  (LEXAPRO ) 20 MG tablet TAKE ONE TABLET BY MOUTH ONE TIME DAILY 90 tablet 3   levocetirizine (XYZAL ) 5 MG tablet TAKE 1 TABLET BY MOUTH EVERY DAY IN THE EVENING 30 tablet 0   metoprolol  tartrate (LOPRESSOR ) 25 MG tablet TAKE ONE TABLET BY MOUTH TWICE A DAY 180 tablet 3   Multiple Vitamin (MULTI-VITAMINS) TABS Take  by mouth.     tolterodine  (DETROL  LA) 4 MG 24 hr capsule Take 1 capsule (4 mg total) by mouth daily. 30 capsule 2   No current facility-administered medications on file prior to visit.    Review of Systems:  As per HPI- otherwise negative.   Physical Examination: There were no vitals filed for this visit. There were no vitals filed for this visit. There is no height or weight on file to calculate BMI. Ideal Body Weight:    GEN: no acute distress. HEENT: Atraumatic, Normocephalic.  Ears and Nose: No external deformity. CV: RRR, No M/G/R. No JVD. No thrill. No extra heart sounds. PULM: CTA B, no wheezes, crackles, rhonchi. No retractions. No resp. distress. No accessory muscle use. ABD: S, NT, ND, +BS. No rebound. No HSM. EXTR: No c/c/e PSYCH: Normally interactive. Conversant.    Assessment and Plan: No diagnosis found.  Assessment & Plan   Signed Harlene Schroeder, MD

## 2023-12-21 ENCOUNTER — Encounter: Payer: Self-pay | Admitting: Family Medicine

## 2023-12-21 ENCOUNTER — Other Ambulatory Visit (HOSPITAL_BASED_OUTPATIENT_CLINIC_OR_DEPARTMENT_OTHER): Payer: Self-pay

## 2023-12-21 ENCOUNTER — Ambulatory Visit: Admitting: Family Medicine

## 2023-12-21 VITALS — BP 138/60 | HR 52 | Temp 98.4°F | Resp 16 | Ht 64.0 in | Wt 173.0 lb

## 2023-12-21 DIAGNOSIS — Z1329 Encounter for screening for other suspected endocrine disorder: Secondary | ICD-10-CM

## 2023-12-21 DIAGNOSIS — Z131 Encounter for screening for diabetes mellitus: Secondary | ICD-10-CM | POA: Diagnosis not present

## 2023-12-21 DIAGNOSIS — I10 Essential (primary) hypertension: Secondary | ICD-10-CM

## 2023-12-21 DIAGNOSIS — Z1231 Encounter for screening mammogram for malignant neoplasm of breast: Secondary | ICD-10-CM | POA: Diagnosis not present

## 2023-12-21 DIAGNOSIS — K7581 Nonalcoholic steatohepatitis (NASH): Secondary | ICD-10-CM

## 2023-12-21 LAB — COMPREHENSIVE METABOLIC PANEL WITH GFR
ALT: 17 U/L (ref 0–35)
AST: 21 U/L (ref 0–37)
Albumin: 4.4 g/dL (ref 3.5–5.2)
Alkaline Phosphatase: 72 U/L (ref 39–117)
BUN: 14 mg/dL (ref 6–23)
CO2: 30 meq/L (ref 19–32)
Calcium: 10.5 mg/dL (ref 8.4–10.5)
Chloride: 104 meq/L (ref 96–112)
Creatinine, Ser: 0.81 mg/dL (ref 0.40–1.20)
GFR: 70.21 mL/min (ref >60.00)
Glucose, Bld: 76 mg/dL (ref 70–99)
Potassium: 4.2 meq/L (ref 3.5–5.1)
Sodium: 141 meq/L (ref 135–145)
Total Bilirubin: 0.6 mg/dL (ref 0.2–1.2)
Total Protein: 6.7 g/dL (ref 6.0–8.3)

## 2023-12-21 LAB — LIPID PANEL
Cholesterol: 238 mg/dL — ABNORMAL HIGH (ref 0–200)
HDL: 55.2 mg/dL (ref >39.00)
LDL Cholesterol: 153 mg/dL — ABNORMAL HIGH (ref 0–99)
NonHDL: 182.7
Total CHOL/HDL Ratio: 4
Triglycerides: 149 mg/dL (ref 0.0–149.0)
VLDL: 29.8 mg/dL (ref 0.0–40.0)

## 2023-12-21 LAB — TSH: TSH: 2.06 u[IU]/mL (ref 0.35–5.50)

## 2023-12-21 LAB — HEMOGLOBIN A1C: Hgb A1c MFr Bld: 5.4 % (ref 4.6–6.5)

## 2023-12-21 LAB — CBC
HCT: 42.4 % (ref 36.0–46.0)
Hemoglobin: 14.1 g/dL (ref 12.0–15.0)
MCHC: 33.3 g/dL (ref 30.0–36.0)
MCV: 86.3 fl (ref 78.0–100.0)
Platelets: 307 K/uL (ref 150.0–400.0)
RBC: 4.91 Mil/uL (ref 3.87–5.11)
RDW: 12.5 % (ref 11.5–15.5)
WBC: 7.2 K/uL (ref 4.0–10.5)

## 2023-12-21 MED ORDER — AREXVY 120 MCG/0.5ML IM SUSR
0.5000 mL | Freq: Once | INTRAMUSCULAR | 0 refills | Status: AC
  Filled 2023-12-21: qty 0.5, 1d supply, fill #0

## 2024-01-01 ENCOUNTER — Encounter: Payer: Self-pay | Admitting: Family Medicine

## 2024-01-01 ENCOUNTER — Telehealth: Payer: Self-pay

## 2024-01-01 NOTE — Telephone Encounter (Signed)
 Copied from CRM #[id number]. Topic: General - Other >> Jan 01, 2024 12:52 PM Thersia C wrote: Reason for CRM: Patient called in regarding needing the ultrasound moved to somewhere in [place]  where they will be for a couple of weeks, would like for Dr.Coplands nurse to give a callback regarding this

## 2024-01-25 ENCOUNTER — Telehealth: Payer: Self-pay

## 2024-01-25 NOTE — Telephone Encounter (Signed)
 Copied from CRM [id number]. Topic: Medical Record Request - Records Request >> Jan 25, 2024 12:40 PM Tysheama G wrote: Reason for CRM: Patient is in [place]  [place] in [place]  [place] awhile and she will be going to a dr down there and wants to know if Dr.Copland can send her records there. Dr.Shannon Atsinger [address]  Phone number [phone]. She has an appointment with her next Tuesday so she needs those records sent asap.

## 2024-01-30 NOTE — Telephone Encounter (Signed)
 Spoke w/ Pt- informed that she will need to complete medical record request at new office and have them fax to us . Pt verbalized understanding.

## 2024-06-24 ENCOUNTER — Ambulatory Visit: Admitting: Family Medicine
